# Patient Record
Sex: Female | Born: 1985 | Hispanic: Yes | Marital: Married | Smoking: Never smoker
Health system: Southern US, Community
[De-identification: ages and names within clinical notes are randomized; demographics above are authoritative.]

## PROBLEM LIST (undated history)

## (undated) ENCOUNTER — Inpatient Hospital Stay: Payer: Self-pay

## (undated) DIAGNOSIS — F320 Major depressive disorder, single episode, mild: Secondary | ICD-10-CM

## (undated) DIAGNOSIS — F32A Depression, unspecified: Secondary | ICD-10-CM

## (undated) DIAGNOSIS — G43909 Migraine, unspecified, not intractable, without status migrainosus: Secondary | ICD-10-CM

## (undated) HISTORY — PX: TUBAL LIGATION: SHX77

---

## 2012-08-16 ENCOUNTER — Observation Stay: Payer: Self-pay | Admitting: Obstetrics and Gynecology

## 2012-09-22 ENCOUNTER — Inpatient Hospital Stay: Payer: Self-pay

## 2012-09-22 DIAGNOSIS — O368390 Maternal care for abnormalities of the fetal heart rate or rhythm, unspecified trimester, not applicable or unspecified: Secondary | ICD-10-CM

## 2012-09-22 HISTORY — PX: OTHER SURGICAL HISTORY: SHX169

## 2012-09-22 LAB — CBC WITH DIFFERENTIAL/PLATELET
Basophil #: 0.1 10*3/uL (ref 0.0–0.1)
Basophil %: 0.5 %
Eosinophil #: 0.1 10*3/uL (ref 0.0–0.7)
Eosinophil %: 0.5 %
HGB: 13.1 g/dL (ref 12.0–16.0)
Lymphocyte #: 2.1 10*3/uL (ref 1.0–3.6)
Lymphocyte %: 15.5 %
Monocyte #: 0.8 x10 3/mm (ref 0.2–0.9)
Neutrophil #: 10.6 10*3/uL — ABNORMAL HIGH (ref 1.4–6.5)
Neutrophil %: 77.9 %
Platelet: 160 10*3/uL (ref 150–440)
WBC: 13.6 10*3/uL — ABNORMAL HIGH (ref 3.6–11.0)

## 2012-09-23 LAB — HEMATOCRIT: HCT: 30.8 % — ABNORMAL LOW (ref 35.0–47.0)

## 2014-04-24 NOTE — Op Note (Signed)
PATIENT NAME:  Lauren [name], [place] MR#:  [id number] DATE OF BIRTH:  [birth year]  DATE OF PROCEDURE:  09/22/2012  PREOPERATIVE DIAGNOSIS: Persistent late decelerations at 36 weeks.  POSTOPERATIVE DIAGNOSIS: Persistent late decelerations at 36 weeks with very small teacup saucer size placenta.  PROCEDURE: Primary lower uterine transverse Cesarean section.  SURGEON: Adria Devonarrie Lizza Huffaker, MD  ESTIMATED BLOOD LOSS: 1000 mL.  FINDINGS: Preterm liveborn female infant with very, very small placenta.  DESCRIPTION OF PROCEDURE: The patient was taken to the operating room and placed in the supine position. After adequate spinal anesthesia was established, the patient was prepped and draped in the usual sterile fashion. Pfannenstiel skin incision was drawn and cut approximately 2 fingerbreadths above the pubic symphysis and carried sharply down to the fascia. The fascia was nicked in the midline and the incision was extended in superolateral manner. The rectus bellies were sharply and bluntly dissected off the rectus fascia. The midline of the muscle bellies was identified and split. The peritoneum was grasped, cut and entered. Bladder blade was placed. A bladder flap was created. Uterine incision was made. Infant was delivered. Cord was clamped and cut. Infant was handed to the awaiting pediatrician. Pitocin was started. Placenta was delivered. Uterus was delivered and wrapped in a moist laparotomy sponge. The interior of the uterus was curetted with a moist lap sponge. The belly was cleared of clot. The uterine incision was closed with a running locked chromic suture and a running imbricating chromic suture. The bladder flap was tacked back up to the uterine incision. The uterus was placed back into the abdomen. The gutters were cleared of clots. Muscle bellies were approximated with a running Vicryl suture. On-Q trocars were placed. Catheters were threaded and wrapped underneath the rectus fascia. The fascia was closed with a  running Vicryl suture. The skin was closed with a 4-0 Monocryl. Dermabond was placed on the trocar catheter site. 4 x 4 was placed to wrap the catheters around. Tegaderms were placed. Bandages were placed. The uterus was massaged, and the patient was taken to recovery after having tolerated the procedure with clear urine noted in the Foley bag.  ____________________________ Elliot Gurneyarrie C. Milania Haubner, MD cck:sb D: 10/01/2012 10:37:00 ET T: 10/01/2012 11:25:46 ET JOB#: [id number]  cc: Elliot Gurneyarrie C. Ruie Sendejo, MD, <Dictator> Elliot GurneyARRIE C Gracin Soohoo MD ELECTRONICALLY SIGNED 10/01/2012 22:30

## 2014-05-12 NOTE — H&P (Signed)
L&D Evaluation:  History Expanded:  HPI pthere because she fell to her knees and wanted to be sure baby was ok, she did not hit her belly. she is 31 weeks pregantn unable to get records fromt he office,   Gravida 1   Term 0   PreTerm 0   Abortion 0   Living 0   Group B Strep Results Maternal (Result >5wks must be treated as unknown) unknown/result > 5 weeks ago   [facility]   Presents with fell down   Patient's Medical History No Chronic Illness   Patient's Surgical History none   Medications Pre Natal Vitamins   Allergies NKDA   Social History none   Family History Non-Contributory   ROS:  ROS All systems were reviewed.  HEENT, CNS, GI, GU, Respiratory, CV, Renal and Musculoskeletal systems were found to be normal.   Exam:  Vital Signs stable   Urine Protein not completed   Mental Status clear   Chest clear   Heart normal sinus rhythm   Abdomen gravid, non-tender   Estimated Fetal Weight Average for gestational age   Pelvic no external lesions   Mebranes Intact   Description clear   FHT normal rate with no decels   FHT Description cat 1 tracing   Fetal Heart Rate 140   Ucx absent   Skin dry   Impression:  Impression fell   Plan:  Plan monitor contractions and for cervical change   Comments monitor for ctx and check blood type and montiir for one hour and then send home.   Follow Up Appointment need to schedule   Electronic Signatures: Adria DevonKlett, Yonah Tangeman (MD)  (Signed 15-Aug-14 16:32)  Authored: L&D Evaluation   Last Updated: 15-Aug-14 16:32 by Adria DevonKlett, Yahsir Wickens (MD)

## 2014-05-12 NOTE — H&P (Signed)
L&D Evaluation:  History Expanded:  HPI 29 yo G1P0 at 4934w3d who comes in with contractions since last night st 930pm. she has had an unremarkable prenatal course.glucola 105, declined tdap at the office on 08/12/12. pt has had anemia this pregnancy and has been on iron supplements, she is varcella immune. first and second trimester us verify dates. pt comes in tonight with contractions and is having late decels with most contractons. given terb 0.25mg  subcutaneous x 1 to decrease contractions with good results of decreasing the ctx and thus the lates.   Gravida 1   Term 0   PreTerm 0   Abortion 0   Living 0   Blood Type (Maternal) O positive   Group B Strep Results Maternal (Result >5wks must be treated as unknown) unknown/result > 5 weeks ago   Maternal HIV Negative   Maternal Syphilis Ab Nonreactive   Maternal Varicella Immune   Rubella Results (Maternal) immune   Maternal T-Dap Unknown   [facility]   Presents with contractions, late decels   Patient's Medical History No Chronic Illness   Patient's Surgical History none   Medications Pre Natal Vitamins  Iron   Allergies NKDA   Social History none   Family History Non-Contributory   ROS:  ROS All systems were reviewed.  HEENT, CNS, GI, GU, Respiratory, CV, Renal and Musculoskeletal systems were found to be normal.   Exam:  Vital Signs stable   Urine Protein not completed   General no apparent distress   Mental Status clear   Chest clear   Heart normal sinus rhythm   Estimated Fetal Weight Small for gestational age   Back no CVAT   Edema no edema   Reflexes 1+   Pelvic no external lesions   Mebranes Intact   FHT other   FHT Description Late decelerations, baseline 140   Ucx regular   Ucx Frequency 3 min   Skin dry   Lymph no lymphadenopathy   Impression:  Impression early labor, fetal intolerance to labor remote from delivery   Plan:  Plan EFM/NST   Comments fetal  intolerance to labor will procede to csection, pt aware of reasons, nicu aware   Follow Up Appointment need to schedule. in 6 weeks   Electronic Signatures: Adria DevonKlett, Angeliyah Kirkey (MD)  (Signed 21-Sep-14 04:38)  Authored: L&D Evaluation   Last Updated: 21-Sep-14 04:38 by Adria DevonKlett, Lianny Molter (MD)

## 2014-10-20 ENCOUNTER — Ambulatory Visit (INDEPENDENT_AMBULATORY_CARE_PROVIDER_SITE_OTHER): Payer: BLUE CROSS/BLUE SHIELD | Admitting: Sports Medicine

## 2014-10-20 ENCOUNTER — Ambulatory Visit: Payer: Self-pay

## 2014-10-20 ENCOUNTER — Encounter: Payer: Self-pay | Admitting: Sports Medicine

## 2014-10-20 VITALS — BP 112/69 | HR 59 | Resp 16 | Ht 66.0 in | Wt 155.0 lb

## 2014-10-20 DIAGNOSIS — M79671 Pain in right foot: Secondary | ICD-10-CM

## 2014-10-20 DIAGNOSIS — M216X2 Other acquired deformities of left foot: Secondary | ICD-10-CM | POA: Diagnosis not present

## 2014-10-20 DIAGNOSIS — M722 Plantar fascial fibromatosis: Secondary | ICD-10-CM

## 2014-10-20 DIAGNOSIS — M216X1 Other acquired deformities of right foot: Secondary | ICD-10-CM

## 2014-10-20 MED ORDER — MELOXICAM 15 MG PO TABS: 15.0000 mg | ORAL_TABLET | Freq: Every day | ORAL | Status: DC

## 2014-10-20 MED ORDER — TRIAMCINOLONE ACETONIDE 10 MG/ML IJ SUSP: 10.0000 mg | Freq: Once | INTRAMUSCULAR | Status: DC

## 2014-10-20 NOTE — Patient Instructions (Signed)
Plantar Fasciitis Plantar fasciitis is a painful foot condition that affects the heel. It occurs when the band of tissue that connects the toes to the heel bone (plantar fascia) becomes irritated. This can happen after exercising too much or doing other repetitive activities (overuse injury). The pain from plantar fasciitis can range from mild irritation to severe pain that makes it difficult for you to walk or move. The pain is usually worse in the morning or after you have been sitting or lying down for a while. CAUSES This condition may be caused by:  Standing for long periods of time.  Wearing shoes that do not fit.  Doing high-impact activities, including running, aerobics, and ballet.  Being overweight.  Having an abnormal way of walking (gait).  Having tight calf muscles.  Having high arches in your feet.  Starting a new athletic activity. SYMPTOMS The main symptom of this condition is heel pain. Other symptoms include:  Pain that gets worse after activity or exercise.  Pain that is worse in the morning or after resting.  Pain that goes away after you walk for a few minutes. DIAGNOSIS This condition may be diagnosed based on your signs and symptoms. Your health care provider will also do a physical exam to check for:  A tender area on the bottom of your foot.  A high arch in your foot.  Pain when you move your foot.  Difficulty moving your foot. You may also need to have imaging studies to confirm the diagnosis. These can include:  X-rays.  Ultrasound.  MRI. TREATMENT  Treatment for plantar fasciitis depends on the severity of the condition. Your treatment may include:  Rest, ice, and over-the-counter pain medicines to manage your pain.  Exercises to stretch your calves and your plantar fascia.  A splint that holds your foot in a stretched, upward position while you sleep (night splint).  Physical therapy to relieve symptoms and prevent problems in the  future.  Cortisone injections to relieve severe pain.  Extracorporeal shock wave therapy (ESWT) to stimulate damaged plantar fascia with electrical impulses. It is often used as a last resort before surgery.  Surgery, if other treatments have not worked after 12 months. HOME CARE INSTRUCTIONS  Take medicines only as directed by your health care provider.  Avoid activities that cause pain.  Roll the bottom of your foot over a bag of ice or a bottle of cold water. Do this for 20 minutes, 3-4 times a day.  Perform simple stretches as directed by your health care provider.  Try wearing athletic shoes with air-sole or gel-sole cushions or soft shoe inserts.  Wear a night splint while sleeping, if directed by your health care provider.  Keep all follow-up appointments with your health care provider. PREVENTION   Do not perform exercises or activities that cause heel pain.  Consider finding low-impact activities if you continue to have problems.  Lose weight if you need to. The best way to prevent plantar fasciitis is to avoid the activities that aggravate your plantar fascia. SEEK MEDICAL CARE IF:  Your symptoms do not go away after treatment with home care measures.  Your pain gets worse.  Your pain affects your ability to move or do your daily activities.   This information is not intended to replace advice given to you by your health care provider. Make sure you discuss any questions you have with your health care provider.   Document Released: 09/13/2000 Document Revised: 09/09/2014 Document Reviewed: 10/29/2013 Elsevier   Interactive Patient Education 2016 [employer].  

## 2014-10-20 NOTE — Progress Notes (Deleted)
   Subjective:    Patient ID: Lauren CollumHilda B Miguez, female    DOB: 04/09/1985, 29 y.o.   MRN: [id number]  HPI    Review of Systems  Cardiovascular: Positive for leg swelling.       Calf pain with walking   Musculoskeletal: Positive for gait problem.  All other systems reviewed and are negative.      Objective:   Physical Exam        Assessment & Plan:

## 2014-10-20 NOTE — Progress Notes (Signed)
Patient ID: Lauren CollumHilda B [name], female   DOB: 05/06/1985, 29 y.o.   MRN: [id number] Subjective: Lauren [name] is a 29 y.o. female patient presents to office with complaint of heel pain on the right heel. Patient admits to post static dyskinesia for 3 months in duration. Patient has treated this problem with changing shoes and cutting back on running. Patient reports that this treatment has offered some relief but pain still present especially after a long work shift.  There are no active problems to display for this patient.  No current outpatient prescriptions on file prior to visit.   No current facility-administered medications on file prior to visit.   No Known Allergies  Review of Systems  Cardiovascular: Positive for leg swelling.       Calf pain with walking   Musculoskeletal: Positive for gait problem.  All other systems reviewed and are negative.   Objective: Physical Exam General: The patient is alert and oriented x3 in no acute distress.  Dermatology: Skin is warm, dry and supple bilateral lower extremities. Nails 1-10 are normal. There is no erythema, edema, no eccymosis, no open lesions present. Integument is otherwise unremarkable.  Vascular: Dorsalis Pedis pulse and Posterior Tibial pulse are 2/4 bilateral. Capillary fill time is immediate to all digits.  Neurological: Grossly intact to light touch with an achilles reflex of +2/5 and a  negative Tinel's sign bilateral.  Musculoskeletal: Tenderness to palpation at the medial calcaneal tubercale and through the insertion of the plantar fascia on the right foot. No pain with compression of calcaneus bilateral. No pain with tuning fork to calcaneus bilateral. No pain with calf compression bilateral. There is decreased Ankle joint range of motion right>left. All other joints within normal limits with no pain or crepitus.   Gait: Unassisted, Antalgic avoid weight on right heel  Xray; 3 views right foot: calcaneal  enthesopathy, No fracture/discloation.   Assessment and Plan: Problem List Items Addressed This Visit    None    Visit Diagnoses    Right foot pain    -  Primary    Relevant Medications    meloxicam (MOBIC) 15 MG tablet    Other Relevant Orders    DG Foot Complete Right    Plantar fasciitis of right foot        Relevant Medications    triamcinolone acetonide (KENALOG) 10 MG/ML injection 10 mg    Acquired equinus deformity of both feet        right>left      -Complete examination performed. Discussed with patient in detail the condition of plantar fasciitis, how this occurs and general treatment options. Explained both conservative and surgical treatments.  -After oral consent and aseptic prep, injected a mixture containing 1 ml of 1%  plain lidocaine, 1 ml 0.25% plain marcaine, 0.5 ml of kenalog 10 and 0.5 ml of dexamethasone phosphate into right heel. Post-injection care discussed with patient.  -Rx Meloxicam 15mg  PO daily -Recommended good supportive shoes and advised use of OTC insert.  - Explained in detail the use of the fascial brace/ night splint which was dispensed at today's visit. -Explained and dispensed to patient daily stretching exercises. -Recommend patient to ice affected area 1-2x daily. -Patient to return to office in 4 weeks for follow up or sooner if problems or questions  Arise.  Asencion Islamitorya Roshelle Traub, DPM

## 2014-11-17 ENCOUNTER — Encounter: Payer: Self-pay | Admitting: Sports Medicine

## 2014-11-17 ENCOUNTER — Ambulatory Visit (INDEPENDENT_AMBULATORY_CARE_PROVIDER_SITE_OTHER): Payer: BLUE CROSS/BLUE SHIELD | Admitting: Sports Medicine

## 2014-11-17 DIAGNOSIS — M216X1 Other acquired deformities of right foot: Secondary | ICD-10-CM | POA: Diagnosis not present

## 2014-11-17 DIAGNOSIS — M216X2 Other acquired deformities of left foot: Secondary | ICD-10-CM

## 2014-11-17 DIAGNOSIS — M722 Plantar fascial fibromatosis: Secondary | ICD-10-CM

## 2014-11-17 DIAGNOSIS — M79671 Pain in right foot: Secondary | ICD-10-CM | POA: Diagnosis not present

## 2014-11-17 NOTE — Progress Notes (Signed)
Patient ID: Lauren [name], female   DOB: 12/30/1985, 29 y.o.   MRN: [id number] Subjective: Lauren [name] is a 29 y.o. female returns to office for follow up evaluation after Right heel injection for plantar fasciitis, injection #1 administered 1 month(s) ago. Patient states that the injection seems to help her pain; pain has decreased in frequency to the area. Admits to pregnancy. Patient denies any other recent changes in medications or new problems since last visit.   There are no active problems to display for this patient.  Current Outpatient Prescriptions on File Prior to Visit  Medication Sig Dispense Refill  . meloxicam (MOBIC) 15 MG tablet Take 1 tablet (15 mg total) by mouth daily. 30 tablet 0   Current Facility-Administered Medications on File Prior to Visit  Medication Dose Route Frequency Provider Last Rate Last Dose  . triamcinolone acetonide (KENALOG) 10 MG/ML injection 10 mg  10 mg Other Once [employer] Koen Antilla, DPM       No Known Allergies  Objective:   General:  Alert and oriented x 3, in no acute distress  Dermatology: Skin is warm, dry, and supple bilateral. Nails are within normal limits. There is no lower extremity erythema, no eccymosis, no open lesions present bilateral.   Vascular: Dorsalis Pedis and Posterior Tibial pedal pulses are 2/4 bilateral. + hair growth noted bilateral. Capillary Fill Time is 3 seconds in all digits. No varicosities, No edema bilateral lower extremities.   Neurological: Sensation grossly intact to light touch with an achilles reflex of +2 and a  negative Tinel's sign bilateral. Vibratory, sharp/dull, Semmes Weinstein Monofilament within normal limits.   Musculoskeletal: There is decreased tenderness to palpation at the medial calcaneal tubercale and through the insertion of the plantar fascia on the right foot. No pain with compression to calcaneus or application of tuning fork. There is decreased Ankle joint range of motion bilateral,  right>left. All other jointsrange of motion  within normal limits bilateral. Strength 5/5 bilateral.   Assessment and Plan: Problem List Items Addressed This Visit    None    Visit Diagnoses    Right foot pain    -  Primary    Plantar fasciitis of right foot        Improving    Acquired equinus deformity of both feet           -Complete examination performed.  -Discussed with patient in detail the condition of plantar fasciitis, how this  occurs related to the foot type of the patient and general treatment options. - Patient stopped Meloxicam because just found out that she is pregnant; recommend to keep with discontinuing to take medication for safety of fetal development -Dispensed ice pack and explained proper use.  -Continue with fascial brace and night splint daily.  -Continue with stretching, icing, good supportive shoes, inserts daily.  -Discussed long term care and reocurrence; will closely monitor; if fails to improve will consider other treatment modalities.  -Patient to return to office in 1 month for follow up or sooner if problems or questions arise.  Asencion Islamitorya Mysty Kielty, DPM

## 2014-12-15 ENCOUNTER — Ambulatory Visit: Payer: BLUE CROSS/BLUE SHIELD | Admitting: Sports Medicine

## 2014-12-22 ENCOUNTER — Ambulatory Visit (INDEPENDENT_AMBULATORY_CARE_PROVIDER_SITE_OTHER): Payer: BLUE CROSS/BLUE SHIELD | Admitting: Sports Medicine

## 2014-12-22 ENCOUNTER — Encounter: Payer: Self-pay | Admitting: Sports Medicine

## 2014-12-22 DIAGNOSIS — M79671 Pain in right foot: Secondary | ICD-10-CM | POA: Diagnosis not present

## 2014-12-22 DIAGNOSIS — M722 Plantar fascial fibromatosis: Secondary | ICD-10-CM | POA: Diagnosis not present

## 2014-12-22 DIAGNOSIS — M216X2 Other acquired deformities of left foot: Secondary | ICD-10-CM

## 2014-12-22 DIAGNOSIS — M216X1 Other acquired deformities of right foot: Secondary | ICD-10-CM | POA: Diagnosis not present

## 2014-12-22 NOTE — Progress Notes (Signed)
Patient ID: Lauren CollumHilda B [name], female   DOB: 05/19/1985, 29 y.o.   MRN: [id number] Subjective: Lauren [name] is a 29 y.o. female (expecting mother) returns to office for follow up evaluation after Right heel injection for plantar fasciitis, injection #1 administered 2 month(s) ago. Patient states that the injection seems to help her pain; pain is no longer present area. States that she has stopped wearing brace and that she tried to wear flat shoes with minimal issues. Patient denies any other recent changes in medications or new problems since last visit.   There are no active problems to display for this patient.  No current outpatient prescriptions on file prior to visit.   Current Facility-Administered Medications on File Prior to Visit  Medication Dose Route Frequency Provider Last Rate Last Dose  . triamcinolone acetonide (KENALOG) 10 MG/ML injection 10 mg  10 mg Other Once [employer] Jamilia Jacques, DPM       No Known Allergies  Objective:   General:  Alert and oriented x 3, in no acute distress  Dermatology: Skin is warm, dry, and supple bilateral. Nails are within normal limits. There is no lower extremity erythema, no eccymosis, no open lesions present bilateral.   Vascular: Dorsalis Pedis and Posterior Tibial pedal pulses are 2/4 bilateral. + hair growth noted bilateral. Capillary Fill Time is 3 seconds in all digits. No varicosities, No edema bilateral lower extremities.   Neurological: Sensation grossly intact to light touch with an achilles reflex of +2 and a  negative Tinel's sign bilateral. Vibratory, sharp/dull, Semmes Weinstein Monofilament within normal limits.   Musculoskeletal: There is minimal tenderness to palpation at the medial calcaneal tubercale and through the insertion of the plantar fascia on the right foot. No pain with compression to calcaneus or application of tuning fork. There is decreased Ankle joint range of motion bilateral, right>left. All other jointsrange of  motion  within normal limits bilateral. Strength 5/5 bilateral.   Assessment and Plan: Problem List Items Addressed This Visit    None    Visit Diagnoses    Plantar fasciitis of right foot    -  Primary    much improved    Acquired equinus deformity of both feet        Right foot pain        much improved       -Complete examination performed.  -Discussed with patient in detail the condition of plantar fasciitis, how this  occurs related to the foot type of the patient and general treatment options. -Continue with fascial brace and night splint daily for 1 more month.  -Continue with stretching, icing, good supportive shoes, inserts daily.  -Discussed long term care and reocurrence; will closely monitor especially in the setting of pregnancy -Patient to return to office as needed for follow up or sooner if problems or questions arise.  Asencion Islamitorya Undine Nealis, DPM

## 2015-07-04 ENCOUNTER — Observation Stay
Admission: EM | Admit: 2015-07-04 | Discharge: 2015-07-05 | Disposition: A | Discharge status: Home / Self Care | Payer: BLUE CROSS/BLUE SHIELD | Source: Home / Self Care | Admitting: Obstetrics and Gynecology

## 2015-07-04 HISTORY — DX: Major depressive disorder, single episode, mild: F32.0

## 2015-07-04 HISTORY — DX: Depression, unspecified: F32.A

## 2015-07-04 HISTORY — DX: Migraine, unspecified, not intractable, without status migrainosus: G43.909

## 2015-07-05 ENCOUNTER — Encounter: Payer: Self-pay | Admitting: Certified Nurse Midwife

## 2015-07-05 ENCOUNTER — Inpatient Hospital Stay: Payer: BLUE CROSS/BLUE SHIELD | Admitting: Anesthesiology

## 2015-07-05 ENCOUNTER — Inpatient Hospital Stay
Admission: EM | Admit: 2015-07-05 | Discharge: 2015-07-08 | DRG: 765 | Disposition: A | Discharge status: Home / Self Care | Payer: BLUE CROSS/BLUE SHIELD | Attending: Obstetrics & Gynecology | Admitting: Obstetrics & Gynecology

## 2015-07-05 ENCOUNTER — Encounter
Admission: EM | Disposition: A | Discharge status: Home / Self Care | Payer: Self-pay | Source: Home / Self Care | Attending: Obstetrics & Gynecology

## 2015-07-05 DIAGNOSIS — D62 Acute posthemorrhagic anemia: Secondary | ICD-10-CM | POA: Diagnosis present

## 2015-07-05 DIAGNOSIS — Z3A38 38 weeks gestation of pregnancy: Secondary | ICD-10-CM

## 2015-07-05 DIAGNOSIS — O9902 Anemia complicating childbirth: Secondary | ICD-10-CM | POA: Diagnosis present

## 2015-07-05 DIAGNOSIS — O3443 Maternal care for other abnormalities of cervix, third trimester: Secondary | ICD-10-CM | POA: Diagnosis present

## 2015-07-05 DIAGNOSIS — Z302 Encounter for sterilization: Secondary | ICD-10-CM | POA: Diagnosis not present

## 2015-07-05 DIAGNOSIS — O34211 Maternal care for low transverse scar from previous cesarean delivery: Principal | ICD-10-CM | POA: Diagnosis present

## 2015-07-05 DIAGNOSIS — N841 Polyp of cervix uteri: Secondary | ICD-10-CM | POA: Diagnosis present

## 2015-07-05 DIAGNOSIS — O34219 Maternal care for unspecified type scar from previous cesarean delivery: Secondary | ICD-10-CM | POA: Diagnosis present

## 2015-07-05 DIAGNOSIS — O0993 Supervision of high risk pregnancy, unspecified, third trimester: Secondary | ICD-10-CM

## 2015-07-05 LAB — CBC
HCT: 31.8 % — ABNORMAL LOW (ref 35.0–47.0)
HEMATOCRIT: 29.6 % — AB (ref 35.0–47.0)
HEMOGLOBIN: 10 g/dL — AB (ref 12.0–16.0)
HEMOGLOBIN: 10.9 g/dL — AB (ref 12.0–16.0)
MCH: 27.4 pg (ref 26.0–34.0)
MCH: 27.3 pg (ref 26.0–34.0)
MCHC: 33.9 g/dL (ref 32.0–36.0)
MCHC: 34.4 g/dL (ref 32.0–36.0)
MCV: 80.7 fL (ref 80.0–100.0)
MCV: 79.4 fL — AB (ref 80.0–100.0)
PLATELETS: 177 10*3/uL (ref 150–440)
Platelets: 149 10*3/uL — ABNORMAL LOW (ref 150–440)
RBC: 3.67 MIL/uL — ABNORMAL LOW (ref 3.80–5.20)
RBC: 4 MIL/uL (ref 3.80–5.20)
RDW: 13.5 % (ref 11.5–14.5)
RDW: 13.8 % (ref 11.5–14.5)
WBC: 9.4 10*3/uL (ref 3.6–11.0)
WBC: 11.2 10*3/uL — AB (ref 3.6–11.0)

## 2015-07-05 LAB — TYPE AND SCREEN
ABO/RH(D): O POS
ANTIBODY SCREEN: NEGATIVE

## 2015-07-05 SURGERY — Surgical Case
Anesthesia: Spinal | Wound class: Clean Contaminated

## 2015-07-05 MED ORDER — MORPHINE SULFATE (PF) 0.5 MG/ML IJ SOLN
INTRAMUSCULAR | Status: DC | PRN
  Administered 2015-07-05: .2 mg via EPIDURAL

## 2015-07-05 MED ORDER — OXYTOCIN 40 UNITS IN LACTATED RINGERS INFUSION - SIMPLE MED
2.5000 [IU]/h | INTRAVENOUS | Status: DC
  Administered 2015-07-05: 1 mL via INTRAVENOUS
  Administered 2015-07-06: 399 mL via INTRAVENOUS

## 2015-07-05 MED ORDER — SOD CITRATE-CITRIC ACID 500-334 MG/5ML PO SOLN
ORAL | Status: AC
  Administered 2015-07-05: 30 mL via ORAL
  Filled 2015-07-05: qty 15

## 2015-07-05 MED ORDER — LACTATED RINGERS IV SOLN: INTRAVENOUS | Status: DC

## 2015-07-05 MED ORDER — SODIUM CHLORIDE 0.9 % IV SOLN
INTRAVENOUS | Status: DC | PRN
  Administered 2015-07-05: 50 mL via INTRAMUSCULAR

## 2015-07-05 MED ORDER — TERBUTALINE SULFATE 1 MG/ML IJ SOLN
0.2500 mg | Freq: Once | INTRAMUSCULAR | Status: AC
  Administered 2015-07-05: 0.25 mg via SUBCUTANEOUS
  Filled 2015-07-05: qty 1

## 2015-07-05 MED ORDER — CEFAZOLIN SODIUM-DEXTROSE 2-4 GM/100ML-% IV SOLN
INTRAVENOUS | Status: AC
  Filled 2015-07-05: qty 100

## 2015-07-05 MED ORDER — LACTATED RINGERS IV SOLN
INTRAVENOUS | Status: DC | PRN
  Administered 2015-07-05: 23:00:00 via INTRAVENOUS

## 2015-07-05 MED ORDER — KETOROLAC TROMETHAMINE 30 MG/ML IJ SOLN: 30.0000 mg | Freq: Four times a day (QID) | INTRAMUSCULAR | Status: AC | PRN

## 2015-07-05 MED ORDER — BUPIVACAINE HCL (PF) 0.5 % IJ SOLN: 10.0000 mL | Freq: Once | INTRAMUSCULAR | Status: DC

## 2015-07-05 MED ORDER — LACTATED RINGERS IV BOLUS (SEPSIS)
500.0000 mL | Freq: Once | INTRAVENOUS | Status: AC
  Administered 2015-07-05: 500 mL via INTRAVENOUS

## 2015-07-05 MED ORDER — OXYTOCIN BOLUS FROM INFUSION: 500.0000 mL | INTRAVENOUS | Status: DC

## 2015-07-05 MED ORDER — PHENYLEPHRINE HCL 10 MG/ML IJ SOLN
INTRAMUSCULAR | Status: DC | PRN
  Administered 2015-07-05: 100 ug via INTRAVENOUS

## 2015-07-05 MED ORDER — ACETAMINOPHEN 650 MG RE SUPP
650.0000 mg | Freq: Once | RECTAL | Status: AC
  Administered 2015-07-05: 650 mg via RECTAL
  Filled 2015-07-05: qty 1

## 2015-07-05 MED ORDER — BUPIVACAINE 0.25 % ON-Q PUMP DUAL CATH 400 ML: 400.0000 mL | INJECTION | Status: DC

## 2015-07-05 MED ORDER — LACTATED RINGERS IV SOLN: 500.0000 mL | INTRAVENOUS | Status: DC | PRN

## 2015-07-05 MED ORDER — KETOROLAC TROMETHAMINE 30 MG/ML IJ SOLN
30.0000 mg | Freq: Four times a day (QID) | INTRAMUSCULAR | Status: AC | PRN
Start: 2015-07-05 — End: 2015-07-06

## 2015-07-05 MED ORDER — ONDANSETRON HCL 4 MG/2ML IJ SOLN
INTRAMUSCULAR | Status: DC | PRN
  Administered 2015-07-05: 4 mg via INTRAVENOUS

## 2015-07-05 MED ORDER — BUPIVACAINE LIPOSOME 1.3 % IJ SUSP
INTRAMUSCULAR | Status: DC | PRN
  Administered 2015-07-05: 50 mL

## 2015-07-05 MED ORDER — BUPIVACAINE IN DEXTROSE 0.75-8.25 % IT SOLN
INTRATHECAL | Status: DC | PRN
  Administered 2015-07-05: 1.6 mL via INTRATHECAL

## 2015-07-05 MED ORDER — SOD CITRATE-CITRIC ACID 500-334 MG/5ML PO SOLN
30.0000 mL | ORAL | Status: AC
  Administered 2015-07-05: 30 mL via ORAL

## 2015-07-05 MED ORDER — CEFAZOLIN SODIUM-DEXTROSE 2-4 GM/100ML-% IV SOLN
2.0000 g | INTRAVENOUS | Status: AC
  Administered 2015-07-05: 2 g via INTRAVENOUS

## 2015-07-05 MED ORDER — BUPIVACAINE HCL (PF) 0.5 % IJ SOLN
INTRAMUSCULAR | Status: AC
  Filled 2015-07-05: qty 30

## 2015-07-05 MED ORDER — EPHEDRINE SULFATE 50 MG/ML IJ SOLN
INTRAMUSCULAR | Status: DC | PRN
  Administered 2015-07-05: 5 mg via INTRAVENOUS

## 2015-07-05 MED ORDER — BUPIVACAINE 0.25 % ON-Q PUMP DUAL CATH 400 ML
INJECTION | Status: AC
  Filled 2015-07-05: qty 400

## 2015-07-05 SURGICAL SUPPLY — 33 items
BASIN GRAD PLASTIC 32OZ STRL (MISCELLANEOUS) ×3 IMPLANT
CANISTER SUCT 3000ML (MISCELLANEOUS) ×3 IMPLANT
CATH KIT ON-Q SILVERSOAK 5IN (CATHETERS) IMPLANT
CLOSURE WOUND 1/2 X4 (GAUZE/BANDAGES/DRESSINGS) ×1
DRSG TELFA 3X8 NADH (GAUZE/BANDAGES/DRESSINGS) ×3 IMPLANT
ELECT CAUTERY BLADE 6.4 (BLADE) ×3 IMPLANT
ELECT REM PT RETURN 9FT ADLT (ELECTROSURGICAL) ×3
ELECTRODE REM PT RTRN 9FT ADLT (ELECTROSURGICAL) ×1 IMPLANT
GAUZE SPONGE 4X4 12PLY STRL (GAUZE/BANDAGES/DRESSINGS) ×3 IMPLANT
GLOVE BIOGEL PI IND STRL 6.5 (GLOVE) ×6 IMPLANT
GLOVE BIOGEL PI INDICATOR 6.5 (GLOVE) ×12
GLOVE SURG SYN 6.5 ES PF (GLOVE) ×3 IMPLANT
GOWN STRL REUS W/ TWL LRG LVL3 (GOWN DISPOSABLE) ×3 IMPLANT
GOWN STRL REUS W/TWL LRG LVL3 (GOWN DISPOSABLE) ×6
LIQUID BAND (GAUZE/BANDAGES/DRESSINGS) ×3 IMPLANT
NEEDLE HYPO 22GX1.5 SAFETY (NEEDLE) ×3 IMPLANT
NS IRRIG 1000ML POUR BTL (IV SOLUTION) ×3 IMPLANT
PACK C SECTION AR (MISCELLANEOUS) ×3 IMPLANT
PAD OB MATERNITY 4.3X12.25 (PERSONAL CARE ITEMS) ×3 IMPLANT
PAD PREP 24X41 OB/GYN DISP (PERSONAL CARE ITEMS) ×3 IMPLANT
STRAP SAFETY BODY (MISCELLANEOUS) ×3 IMPLANT
STRIP CLOSURE SKIN 1/2X4 (GAUZE/BANDAGES/DRESSINGS) ×2 IMPLANT
SUT MNCRL 4-0 (SUTURE) ×2
SUT MNCRL 4-0 27XMFL (SUTURE) ×1
SUT PDS AB 1 TP1 96 (SUTURE) ×3 IMPLANT
SUT VIC AB 0 CT1 36 (SUTURE) ×6 IMPLANT
SUT VIC AB 2-0 CT1 27 (SUTURE) ×8
SUT VIC AB 2-0 CT1 TAPERPNT 27 (SUTURE) ×4 IMPLANT
SUT VIC AB 3-0 SH 27 (SUTURE) ×2
SUT VIC AB 3-0 SH 27X BRD (SUTURE) ×1 IMPLANT
SUTURE MNCRL 4-0 27XMF (SUTURE) ×1 IMPLANT
SWABSTK COMLB BENZOIN TINCTURE (MISCELLANEOUS) ×3 IMPLANT
SYR 30ML LL (SYRINGE) ×3 IMPLANT

## 2015-07-05 NOTE — Anesthesia Preprocedure Evaluation (Signed)
Anesthesia Evaluation  Patient identified by MRN, date of birth, ID band Patient awake    Reviewed: Allergy & Precautions, NPO status , Patient's Chart, lab work & pertinent test results, reviewed documented beta blocker date and time   Airway Mallampati: II  TM Distance: >3 FB     Dental  (+) Chipped   Pulmonary           Cardiovascular      Neuro/Psych  Headaches, PSYCHIATRIC DISORDERS Depression    GI/Hepatic   Endo/Other    Renal/GU      Musculoskeletal   Abdominal   Peds  Hematology   Anesthesia Other Findings   Reproductive/Obstetrics                             Anesthesia Physical Anesthesia Plan  ASA: II  Anesthesia Plan: Spinal   Post-op Pain Management:    Induction:   Airway Management Planned:   Additional Equipment:   Intra-op Plan:   Post-operative Plan:   Informed Consent: I have reviewed the patients History and Physical, chart, labs and discussed the procedure including the risks, benefits and alternatives for the proposed anesthesia with the patient or authorized representative who has indicated his/her understanding and acceptance.     Plan Discussed with: CRNA  Anesthesia Plan Comments:         Anesthesia Quick Evaluation

## 2015-07-05 NOTE — H&P (Signed)
OB History & Physical   History of Present Illness:  Chief Complaint:  Complains of contractions since 1000 this am. Denies bleeding or leakage of fluid. +FM  HPI:  Lauren [name] is a 30 y.o. G1P0 female with EDC=07/13/2015 presents at 4479w6d dated by a 9wk3d. Her pregnancy has been complicated by a history of a LTCS at 36 weeks for a nonreassuring FHR tracing at 36 weeks.. She is scheduled for a repat Cesarean section and BTL on 6 July. . She presents to L&D for evaluation of labor. Pt was evaluated for the same complaint just after midnight this morning, d/c home after terbutaline and IV hydration.  Prenatal care site: [facility] has also been remarkable for an asymptomatic cervical polyp and an elevated one hour=152 and a normal 3hr GTT. She desires sterilization. Wants to bottle feed. Declined TDAP during pregnancy.     Maternal Medical History:   Past Medical History  Diagnosis Date  . Migraine   . Mild depression     age 30    Past Surgical History  Procedure Laterality Date  . Low transverse cesarean      Klett-Fetal indications at 36 wk    Allergies  Allergen Reactions  . Peanut-Containing Drug Products Rash    Prior to Admission medications   Medication Sig Start Date End Date Taking? Authorizing Provider  Prenat-FeFum-FePo-FA-Omega 3 (CONCEPT DHA) 53.5-38-1 MG CAPS TAKE 1 CAPSULE BY ORAL ROUTE ONCE DAILY 11/20/14   Historical Provider, MD          Social History: She  reports that she has never smoked. She has never used smokeless tobacco. She reports that she does not drink alcohol or use illicit drugs.  Family History: family history is not on file.   Review of Systems: Negative x 10 systems reviewed except as noted in the HPI.     Physical Exam:  Vital Signs: BP 126/77 mmHg  Pulse 86  Temp(Src) 97.1 F (36.2 C) (Oral)  Resp 18  Ht 5\' 6"  (1.676 m)   Wt 81.194 kg (179 lb)  BMI 28.91 kg/m2  LMP (LMP Unknown) General: appears uncomfortable  HEENT: normocephalic, atraumatic Heart: regular rate & rhythm. No murmurs Lungs: clear to auscultation bilaterally Abdomen: soft, gravid, tender with ctx. Pelvic:  External: Normal external female genitalia Cervix: Dilation: 1.5 / Effacement (%): 90 / Station: -1 /posterior/ medium soft/ cephalic (confirmed by bedside US) Neurologic: Alert & oriented x 3.   Pertinent Results:  Prenatal Labs: Blood type/Rh O positive  Antibody screen negative  Rubella Varicella Immune Immune  RPR Non reactive  HBsAg negative  HIV negative  GC negative  Chlamydia negative  Genetic screening declined  1 hour GTT 152  3 hour GTT 78/ 167/101/91  GBS negative on 6/15   Baseline FHR: 135 baseline with accelerations, moderate variability, no decelerations Toco: q2-4 min   Assessment:  Lauren [name] is a 30 y.o. G1P0 female at 1179w6d with history of prior LTCS with contractions in early labor  Plan: 1.  Admit to Labor & Delivery - Dr Elesa MassedWard notified. Decision to proceed with repeat C-section given pt's 2nd admission for contractions in 24 hours.  2. CBC, RPR, T&S, NPO, IVF bolus 3. GBS negative  4. Obtain consents 5. Repeat Cesarean section with BTL.

## 2015-07-05 NOTE — OB Triage Note (Signed)
Pt presents with c/o ctx since last night that have intensified as of 4pm this afternoon.  Denies LOF or vaginal bleeding.  Reports +FM.  Reports burning with urination.

## 2015-07-05 NOTE — Progress Notes (Signed)
Pt transfer to OR for repeat c/s.

## 2015-07-05 NOTE — Final Progress Note (Signed)
Physician Final Progress Note  Patient ID: Lauren [name] MRN: [id number] DOB/AGE: 31/08/1985 30 y.o.  Admit date: 07/04/2015 Admitting provider: Conard NovakStephen D Jackson, MD Discharge date: 07/05/2015   Admission Diagnoses: IUP at 38wk6d Previous Cesarean section Contractions  Discharge Diagnoses:  IUP at 38.6 weeks  False labor Consults: none  Significant Findings/ Diagnostic Studies: 30 year old G2 p1001 with EDC=07/13/2015 presented at 38wk6 days with complaints of contractions. On arrival cervix was 1 cm and extremely posterior/ long and -1 station. Patient initially received hydration and then was given one dose of terbutaline after which her contractions spaced out and she was able to rest. This morning she has an occasional contraction but most of the uterine activity is uterine irritability. FHR tracing has been Cat1. She was discharged home and [facility]:20 this morning. She is scheduled for a repeat Cesarean section and BTL 6 July. Labor precautions were given.  Procedures:none  Discharge Condition: stable  Disposition: Home  Diet: Regular diet  Discharge Activity: Activity as tolerated     Medication List    ASK your doctor about these medications        CONCEPT DHA 53.5-38-1 MG Caps  TAKE 1 CAPSULE BY ORAL ROUTE ONCE DAILY           Follow-up Information    Follow up with Leola Brazilhelsea C Ward, MD. Go today.   Specialty:  Obstetrics and Gynecology   Why:  keep 820 appt. this morning for pre-op    Contact information:   [address] [place] [id number] [phone]       Total time spent taking care of this patient: 20 minutes  Signed: Farrel ConnersGUTIERREZ, Sayf Kerner 07/05/2015, 7:54 AM

## 2015-07-05 NOTE — H&P (Signed)
OB History & Physical   History of Present Illness:  Chief Complaint:  Complains of contractions since 2100. Denies bleeding or leakage of fluid HPI:  Lauren [name] is a 30 y.o. G1P0 female with EDC=07/13/2015 presents at 1939w6d dated by a 9wk3d.  Her pregnancy has been complicated by a history of a LTCS at 36 weeks for a nonreassuring FHR tracing at 36 weeks.. She is  scheduled for a repat Cesarean section and BTL on 6 July. .  She presents to L&D for evaluation of labor.    Prenatal care site: [facility] has also been remarkable for an asymptomatic  cervical polyp and an elevated one hour=152 and a normal 3hr GTT. She desires sterilization. Wants to bottle feed. Declined TDAP during pregnancy.      Maternal Medical History:   Past Medical History  Diagnosis Date  . Migraine   . Mild depression     age 30    Past Surgical History  Procedure Laterality Date  . Low transverse cesarean      Klett-Fetal indications at 36 wk    Allergies  Allergen Reactions  . Peanut-Containing Drug Products Rash    Prior to Admission medications   Medication Sig Start Date End Date Taking? Authorizing Provider  Prenat-FeFum-FePo-FA-Omega 3 (CONCEPT DHA) 53.5-38-1 MG CAPS TAKE 1 CAPSULE BY ORAL ROUTE ONCE DAILY 11/20/14   Historical Provider, MD          Social History: She  reports that she has never smoked. She has never used smokeless tobacco. She reports that she does not drink alcohol or use illicit drugs.  Family History: family history is not on file.   Review of Systems: Negative x 10 systems reviewed except as noted in the HPI.      Physical Exam:  Vital Signs: BP 126/77 mmHg  Pulse 86  Temp(Src) 97.1 F (36.2 C) (Oral)  Resp 18  Ht 5\' 6"  (1.676 m)  Wt 81.194 kg (179 lb)  BMI 28.91 kg/m2  LMP  (LMP Unknown) General: appears uncomfortable  HEENT: normocephalic, atraumatic Heart: regular rate & rhythm.  No murmurs Lungs: clear to  auscultation bilaterally Abdomen: soft, gravid, non-tender;  EFW: 7# Pelvic:   External: Normal external female genitalia  Cervix: Dilation: 1 / Effacement (%): 70 / Station: -1, 0 / extremely posterior/ medium soft/ cephalic  Extremities: non-tender, symmetric, trace edema bilaterally.  DTRs: +1 to +2 Neurologic: Alert & oriented x 3.    Pertinent Results:  Prenatal Labs: Blood type/Rh O positive  Antibody screen negative  Rubella Varicella Immune Immune  RPR Non reactive  HBsAg negative  HIV negative  GC negative  Chlamydia negative  Genetic screening declined  1 hour GTT 152  3 hour GTT 78/ 167/101/91  GBS negative on 6/15   Baseline FHR: 135 baseline with accelrations to 150s to 160, moderate variability, occasional early deceleration? Toco: q2-4 min apart, palpate mild. Patient feeling contractions in fundal area  Bedside Ultrasound:  Number of Fetus: singleton Presentation: cephalic/LOA   Assessment:  Lauren [name] is a 30 y.o. G1P0 female at 4739w6d with history of prior LTCS. R/O early labor  Plan:  1. Admit to Labor & Delivery - notify attending  2. CBC, T&S, NPO, IVF bolus 3. GBS negative  4. Consents obtained. 5. Monitor for progress. Consider terbutaline if contractions continue after hydration 6. Repeat Cesarean section and BTL if progressing  Lauren [name]  07/05/2015 1:06 AM

## 2015-07-05 NOTE — Anesthesia Procedure Notes (Signed)
Spinal Patient location during procedure: OR Staffing Anesthesiologist: Berdine AddisonHOMAS, Aasiyah Auerbach Performed by: anesthesiologist  Preanesthetic Checklist Completed: patient identified, site marked, surgical consent, pre-op evaluation, timeout performed, IV checked and risks and benefits discussed Spinal Block Patient position: sitting Prep: Betadine Patient monitoring: heart rate, cardiac monitor, continuous pulse ox and blood pressure Approach: midline Location: L3-4 Injection technique: single-shot Needle Needle type: Pencil-Tip  Needle gauge: 25 G Needle length: 9 cm Assessment Sensory level: T10 Additional Notes 2248 marcaine 12.5mg  and astromorph 0.2mg .

## 2015-07-06 DIAGNOSIS — O0993 Supervision of high risk pregnancy, unspecified, third trimester: Secondary | ICD-10-CM

## 2015-07-06 DIAGNOSIS — O34219 Maternal care for unspecified type scar from previous cesarean delivery: Secondary | ICD-10-CM | POA: Diagnosis present

## 2015-07-06 LAB — TYPE AND SCREEN
ABO/RH(D): O POS
Antibody Screen: NEGATIVE

## 2015-07-06 LAB — CBC
HEMATOCRIT: 30.6 % — AB (ref 35.0–47.0)
HEMOGLOBIN: 10.3 g/dL — AB (ref 12.0–16.0)
MCH: 27.5 pg (ref 26.0–34.0)
MCHC: 33.8 g/dL (ref 32.0–36.0)
MCV: 81.6 fL (ref 80.0–100.0)
Platelets: 157 10*3/uL (ref 150–440)
RBC: 3.75 MIL/uL — ABNORMAL LOW (ref 3.80–5.20)
RDW: 13.5 % (ref 11.5–14.5)
WBC: 16.3 10*3/uL — ABNORMAL HIGH (ref 3.6–11.0)

## 2015-07-06 LAB — RPR: RPR: NONREACTIVE

## 2015-07-06 MED ORDER — OXYCODONE HCL 5 MG PO TABS
10.0000 mg | ORAL_TABLET | ORAL | Status: DC | PRN
  Administered 2015-07-08: 10 mg via ORAL
  Filled 2015-07-06 (×2): qty 2

## 2015-07-06 MED ORDER — DIPHENHYDRAMINE HCL 25 MG PO CAPS
25.0000 mg | ORAL_CAPSULE | Freq: Four times a day (QID) | ORAL | Status: DC | PRN
  Administered 2015-07-06: 25 mg via ORAL
  Filled 2015-07-06: qty 1

## 2015-07-06 MED ORDER — SIMETHICONE 80 MG PO CHEW: 160.0000 mg | CHEWABLE_TABLET | Freq: Four times a day (QID) | ORAL | Status: DC | PRN

## 2015-07-06 MED ORDER — SODIUM CHLORIDE 0.9% FLUSH: 3.0000 mL | Freq: Two times a day (BID) | INTRAVENOUS | Status: DC

## 2015-07-06 MED ORDER — NALBUPHINE HCL 10 MG/ML IJ SOLN: 5.0000 mg | INTRAMUSCULAR | Status: DC | PRN

## 2015-07-06 MED ORDER — ACETAMINOPHEN 500 MG PO TABS
1000.0000 mg | ORAL_TABLET | Freq: Four times a day (QID) | ORAL | Status: DC
  Administered 2015-07-06 – 2015-07-08 (×9): 1000 mg via ORAL
  Filled 2015-07-06 (×9): qty 2

## 2015-07-06 MED ORDER — SODIUM CHLORIDE 0.9% FLUSH
3.0000 mL | INTRAVENOUS | Status: DC | PRN
  Administered 2015-07-06 – 2015-07-07 (×2): 3 mL via INTRAVENOUS
  Filled 2015-07-06 (×2): qty 3

## 2015-07-06 MED ORDER — DIBUCAINE 1 % RE OINT: 1.0000 "application " | TOPICAL_OINTMENT | RECTAL | Status: DC | PRN

## 2015-07-06 MED ORDER — OXYTOCIN 40 UNITS IN LACTATED RINGERS INFUSION - SIMPLE MED
2.5000 [IU]/h | INTRAVENOUS | Status: AC
  Filled 2015-07-06: qty 1000

## 2015-07-06 MED ORDER — LACTATED RINGERS IV SOLN
INTRAVENOUS | Status: DC
  Administered 2015-07-06 (×2): via INTRAVENOUS

## 2015-07-06 MED ORDER — NALBUPHINE HCL 10 MG/ML IJ SOLN: 5.0000 mg | Freq: Once | INTRAMUSCULAR | Status: DC | PRN

## 2015-07-06 MED ORDER — IBUPROFEN 600 MG PO TABS
600.0000 mg | ORAL_TABLET | Freq: Four times a day (QID) | ORAL | Status: DC
  Administered 2015-07-06 – 2015-07-08 (×9): 600 mg via ORAL
  Filled 2015-07-06 (×10): qty 1

## 2015-07-06 MED ORDER — PRENATAL MULTIVITAMIN CH
1.0000 | ORAL_TABLET | Freq: Every day | ORAL | Status: DC
  Administered 2015-07-06 – 2015-07-07 (×2): 1 via ORAL
  Filled 2015-07-06 (×3): qty 1

## 2015-07-06 MED ORDER — SCOPOLAMINE 1 MG/3DAYS TD PT72: 1.0000 | MEDICATED_PATCH | Freq: Once | TRANSDERMAL | Status: DC

## 2015-07-06 MED ORDER — SODIUM CHLORIDE 0.9 % IV SOLN: 250.0000 mL | INTRAVENOUS | Status: DC

## 2015-07-06 MED ORDER — COCONUT OIL OIL: 1.0000 "application " | TOPICAL_OIL | Status: DC | PRN

## 2015-07-06 MED ORDER — MENTHOL 3 MG MT LOZG: 1.0000 | LOZENGE | OROMUCOSAL | Status: DC | PRN

## 2015-07-06 MED ORDER — FENTANYL CITRATE (PF) 100 MCG/2ML IJ SOLN: 25.0000 ug | INTRAMUSCULAR | Status: DC | PRN

## 2015-07-06 MED ORDER — SODIUM CHLORIDE 0.9% FLUSH: 3.0000 mL | INTRAVENOUS | Status: DC | PRN

## 2015-07-06 MED ORDER — NALOXONE HCL 0.4 MG/ML IJ SOLN: 0.4000 mg | INTRAMUSCULAR | Status: DC | PRN

## 2015-07-06 MED ORDER — OXYCODONE HCL 5 MG PO TABS
5.0000 mg | ORAL_TABLET | ORAL | Status: DC | PRN
  Administered 2015-07-06 – 2015-07-07 (×3): 5 mg via ORAL
  Filled 2015-07-06 (×3): qty 1

## 2015-07-06 MED ORDER — NALOXONE HCL 2 MG/2ML IJ SOSY: 1.0000 ug/kg/h | PREFILLED_SYRINGE | INTRAVENOUS | Status: DC | PRN

## 2015-07-06 MED ORDER — ONDANSETRON HCL 4 MG/2ML IJ SOLN: 4.0000 mg | Freq: Once | INTRAMUSCULAR | Status: DC | PRN

## 2015-07-06 MED ORDER — TETANUS-DIPHTH-ACELL PERTUSSIS 5-2.5-18.5 LF-MCG/0.5 IM SUSP
0.5000 mL | Freq: Once | INTRAMUSCULAR | Status: DC
Start: 2015-07-07 — End: 2015-07-08

## 2015-07-06 MED ORDER — WITCH HAZEL-GLYCERIN EX PADS: 1.0000 "application " | MEDICATED_PAD | CUTANEOUS | Status: DC | PRN

## 2015-07-06 MED ORDER — ONDANSETRON HCL 4 MG/2ML IJ SOLN: 4.0000 mg | Freq: Three times a day (TID) | INTRAMUSCULAR | Status: DC | PRN

## 2015-07-06 NOTE — Progress Notes (Signed)
Bair hugger applied at this time to increase patient body temperature.

## 2015-07-06 NOTE — Progress Notes (Signed)
Admit Date: 07/05/2015 Today's Date: 07/06/2015  Subjective: Postpartum Day 1: Cesarean Delivery and BTL Patient reports incisional pain and tolerating PO.    Objective: Vital signs in last 24 hours: Temp:  [97.1 F (36.2 C)-98 F (36.7 C)] 97.9 F (36.6 C) (07/04 0740) Pulse Rate:  [52-88] 66 (07/04 0740) Resp:  [14-24] 18 (07/04 0740) BP: (107-127)/(61-91) 115/76 mmHg (07/04 0740) SpO2:  [97 %-100 %] 99 % (07/04 0740) Weight:  [81.647 kg (180 lb)] 81.647 kg (180 lb) (07/03 2110)  Physical Exam:  General: alert, cooperative and no distress Lochia: appropriate Uterine Fundus: firm Incision: healing well DVT Evaluation: No evidence of DVT seen on physical exam.  Recent Labs  07/05/15 2216 07/06/15 0442  HGB 10.9* 10.3*  HCT 31.8* 30.6*    Assessment/Plan: Status post Cesarean section. Doing well postoperatively.  Continue current care. Bottle BTL done Declines TDaP  Lauren LibraRobert Paul Talik [name] 07/06/2015, 9:37 AM

## 2015-07-06 NOTE — Op Note (Signed)
Cesarean Section Procedure Note  07/05/2015 - 07/06/2015  Patient:  Lauren [name]  30 y.o. female at 6460w6d Preoperative diagnosis:  repeat cesarean section Postoperative diagnosis:  same as preop  PROCEDURE:  Procedure(s): CESAREAN SECTION,  Bilateral salpingectomy (N/A)BILTERAL SALPINGECTOMY Surgeon:  Surgeon(s) and Role:    * Chelsea Salena Saner Ward, MD - Primary Anesthesia:  spinal I/O: Total I/O In: 700 [I.V.:700] Out: 300 [Urine:100; Blood:200] Specimens:  Cord Blood,right fallopian tube with fimbriated ends, left fallopian tube with fimbriated ends Complications: None Apparent Disposition:  VS stable to PACU  Findings: normal uterus, tubes and ovaries bilaterally, Live born female Birth Weight: 7 lb 9.3 oz (3440 g), APGAR: 9, 9   Indication for procedure: 30 y.o. female 852P0101 at 6260w6d with history of previous cesarean who presents in labor and does not desire to attempt VBAC. She was scheduled for repeat cesarean with bilateral salpingectomy in 2 days.  Consents were signed today in the office and she elected to proceed with bilateral salpingectomy versus tubal ligation.   Procedure Details   The risks, benefits, complications, treatment options, and expected outcomes were discussed with the patient. Informed consent was obtained. The patient was taken to Operating Room, identified as Lauren [name] and the procedure verified as a cesarean delivery.   After administration of anesthesia, the patient was prepped and draped in the usual sterile manner, including a vaginal prep. A surgical time out was performed, with the pediatric team present. After confirming adequate anesthesia, a Pfannenstiel incision was made and carried down through the subcutaneous tissue to the fascia. Fascial incision was made and extended transversely. The fascia was separated from the underlying rectus tissue superiorly and inferiorly. The peritoneum was identified and entered. Peritoneal incision was extended  longitudinally.  A low transverse uterine incision was made. Delivered from cephalic presentation was a live born female. Delayed cord clamping was performed for 60 seconds, during which time we sang Happy [place]irthday to [place]. The umbilical cord was doubly clamped and cut, and the baby was handed off to the awaitng pediatrician.  Cord blood was obtained for evaluation. The placenta was removed intact and appeared normal. The uterus was delivered from the abdominal cavity and cleared of clots, membranes, and debris. The uterus, tubes and ovaries appeared normal. The uterine incision was closed with running locking sutures of 0 Vicryl, and then a second, imbricating stitch was placed. Hemostasis was observed.   The attention was turned to the bilateral tubes, which were divided by placing a kelly clamp in the mesosalpinx, a Haney stitch placed and tied down in a step-wise fashion with 2-0 vicryl. The tubes were removed from the fimbriated ends to the cornua. These sites were hemostatic.  The abdominal cavity was evacuated of extraneous fluid. The uterus was returned to the abdominal cavity and again the incision was inspected for hemostasis, which was confirmed.  The paracolic gutters were cleaned. The fascia was then reapproximated with running suture of vicryl. 60cc of Long- and short-acting bupivicaine was injected circumferentially into the fascia.  After a change of gloves, the subcutaneous tissue was irrigated and reapproximated with 2-0 plain gut. The skin was closed with 4-0 Monocryl and 40cc of long- and short-acting bupivacaine injected into the skin and subcutaneous tissues.  The incision was covered with surgical glue.     Instrument, sponge, and needle counts were correct prior the abdominal closure and at the conclusion of the case.   I was present and performed this procedure in its entirety.  -----  Ranae Plumberhelsea Ward, MD Attending Obstetrician and Gynecologist [facility]

## 2015-07-06 NOTE — Plan of Care (Signed)
Problem: Nutritional: Goal: Mother's verbalization of comfort with breastfeeding process will improve Outcome: Not Applicable Date Met:  43/92/65 Patient is bottle feeding infant.

## 2015-07-06 NOTE — Anesthesia Postprocedure Evaluation (Signed)
Anesthesia Post Note  Patient: Lauren [name]  Procedure(s) Performed: Procedure(s) (LRB): CESAREAN SECTION,  Bilateral salpingectomy (N/A)  Patient location during evaluation: Other Anesthesia Type: General Level of consciousness: awake and alert Pain management: pain level controlled Vital Signs Assessment: post-procedure vital signs reviewed and stable Respiratory status: spontaneous breathing, nonlabored ventilation, respiratory function stable and patient connected to nasal cannula oxygen Cardiovascular status: blood pressure returned to baseline and stable Postop Assessment: no signs of nausea or vomiting Anesthetic complications: no    Last Vitals:  Filed Vitals:   07/06/15 0558 07/06/15 0740  BP: 120/70 115/76  Pulse: 68 66  Temp: 36.7 C 36.6 C  Resp: 18 18    Last Pain:  Filed Vitals:   07/06/15 0745  PainSc: 0-No pain                 Lauren Aguayo S

## 2015-07-06 NOTE — Discharge Summary (Signed)
Obstetrical Discharge Summary  Patient Name: Lauren [name] DOB: 05/12/1985 MRN: [id number]  Date of Admission: 07/05/2015 Date of Discharge: 07/08/2015  Primary OB: [facility]   Gestational Age at Delivery: 4042w6d   Antepartum complications:history of preterm delivery, cervical polyp Admitting Diagnosis: history of cesarean, currently pregnant, labor, desired permanent sterilization Secondary Diagnosis: Patient Active Problem List   Diagnosis Date Noted  . History of cesarean delivery, currently pregnant 07/06/2015  . Supervision of high risk pregnancy in third trimester 07/06/2015  . Postpartum care following cesarean delivery 07/06/2015  . Indication for care in labor or delivery 07/05/2015  . Labor and delivery, indication for care 07/05/2015    Augmentation: n/a Complications: none apparent  Intrapartum complications/course:  Patient presented with painful contractions and made change from 1-3cm and did not [facility].  Cesarean delivery with bilateral salpingectomy was performed without complication. Low transverse incision with double layer closure.  Exparel + bupivicaine was used for post operative analgesia. Date of Delivery: 06/05/15 Delivered By: Leeroy Bockhelsea Ward Delivery Type: repeat cesarean section, low transverse incision Anesthesia: spinal Placenta: expressed Laceration: n/a Episiotomy: n/a Newborn Data: Live born female  Birth Weight: 7 lb 9.3 oz (3440 g) APGAR: 9, 9    Discharge Physical Exam:  BP 108/69 mmHg  Pulse 85  Temp(Src) 98.9 F (37.2 C) (Oral)  Resp 20  SpO2 100%    General: NAD CV: RRR Pulm: CTABL, nl effort ABD: s/nd/nt, fundus firm and below the umbilicus Lochia: moderate Incision: c/d/i DVT Evaluation: LE non-ttp, no evidence of DVT on exam.  HEMOGLOBIN  Date Value Ref Range Status  07/06/2015 10.3* 12.0 - 16.0 g/dL Final   HGB  Date Value Ref Range Status  09/22/2012 13.1 12.0-16.0 g/dL Final   HCT  Date Value Ref Range  Status  07/06/2015 30.6* 35.0 - 47.0 % Final  09/23/2012 30.8* 35.0-47.0 % Final    Post partum course: routine, uneventful Postpartum Procedures: none Disposition: stable, discharge to home. Baby Feeding: formula Baby Disposition: home with mom  Rh Immune globulin given: n/a Rubella vaccine given: n/a Tdap vaccine given in AP or PP setting: refused Flu vaccine given in AP or PP setting: n/a  Contraception: bilateral salpingectomy  Prenatal Labs:  Blood type/Rh O positive  Antibody screen negative  Rubella Varicella Immune Immune  RPR Non reactive  HBsAg negative  HIV negative  GC negative  Chlamydia negative  Genetic screening declined  1 hour GTT 152  3 hour GTT 78/ 167/101/91  GBS negative on 6/15              Plan:  Lauren [name] was discharged to home in good condition. [facility] with Dr Elesa MassedWard in 1 week for incision check   Discharge Medications:   Medication List    TAKE these medications        acetaminophen 500 MG tablet  Commonly known as:  TYLENOL  Take 2 tablets (1,000 mg total) by mouth every 6 (six) hours.     CONCEPT DHA 53.5-38-1 MG Caps  TAKE 1 CAPSULE BY ORAL ROUTE ONCE DAILY     ibuprofen 600 MG tablet  Commonly known as:  ADVIL,MOTRIN  Take 1 tablet (600 mg total) by mouth every 6 (six) hours.     oxyCODONE 5 MG immediate release tablet  Commonly known as:  Oxy IR/ROXICODONE  Take 1 tablet (5 mg total) by mouth every 4 (four) hours as needed (pain scale 4-7).     Tdap 5-2.5-18.5  LF-MCG/0.5 injection  Commonly known as:  BOOSTRIX  Inject 0.5 mLs into the muscle once.        Follow-up Information    Follow up with Elenora Fenderhelsea C Ward, MD In 1 week.   Specialty:  Obstetrics and Gynecology   Why:  for wound check   Contact information:   [address] [place] [address] [phone]       Signed: ----- Ranae Plumberhelsea Ward, MD Attending  Obstetrician and Gynecologist [facility]

## 2015-07-06 NOTE — Transfer of Care (Signed)
Immediate Anesthesia Transfer of Care Note  Patient: Lauren [name]  Procedure(s) Performed: Procedure(s): CESAREAN SECTION,  Bilateral salpingectomy (N/A)  Patient Location: PACU  Anesthesia Type:Spinal  Level of Consciousness: awake, alert , oriented and patient cooperative  Airway & Oxygen Therapy: Patient Spontanous Breathing  Post-op Assessment: Report given to RN and Post -op Vital signs reviewed and stable  Post vital signs: Reviewed and stable  Last Vitals:  Filed Vitals:   07/05/15 2110 07/05/15 2216  BP: 120/76 125/79  Pulse: 88 84  Temp: 36.7 C   Resp: 24     Last Pain:  Filed Vitals:   07/05/15 2343  PainSc: 10-Worst pain ever         Complications: No apparent anesthesia complications

## 2015-07-07 ENCOUNTER — Other Ambulatory Visit: Payer: Self-pay

## 2015-07-07 ENCOUNTER — Encounter: Payer: Self-pay | Admitting: Obstetrics & Gynecology

## 2015-07-07 NOTE — Progress Notes (Signed)
Post Op Day 2 Subjective:   Pt is doing well. She is ambulating and voiding without difficulty. She is tolerating PO intake and her pain is well controlled with PO meds.   Objective:  Blood pressure 114/62, pulse 60, temperature 97.7 F (36.5 C), temperature source Oral, resp. rate 20, height 5\' 6"  (1.676 m), weight 81.647 kg (180 lb), SpO2 100%  General: NAD Pulmonary: no increased work of breathing Abdomen: non-distended, non-tender, fundus firm at level of umbilicus Incision: Healing well, no s/s infection, no drainage or swelling Extremities: no edema, no erythema, no tenderness   @I /O24@   Assessment:   29 y.o. [id number] postoperativeday # 2   Plan:  1) Acute blood loss anemia - hemodynamically stable and asymptomatic - po ferrous sulfate  2) O+, Rubella Immune, Varicella Immune  3) TDAP declined  4) Bottle/Contraception: BTL  5) Disposition: Home day 3 or 4   Riven Mabile, CNM

## 2015-07-08 ENCOUNTER — Encounter: Admission: RE | Payer: Self-pay | Source: Ambulatory Visit

## 2015-07-08 ENCOUNTER — Inpatient Hospital Stay
Admission: RE | Admit: 2015-07-08 | Payer: BLUE CROSS/BLUE SHIELD | Source: Ambulatory Visit | Admitting: Obstetrics & Gynecology

## 2015-07-08 ENCOUNTER — Encounter: Payer: Self-pay | Admitting: Obstetrics & Gynecology

## 2015-07-08 LAB — RPR: RPR Ser Ql: NONREACTIVE

## 2015-07-08 SURGERY — Surgical Case
Anesthesia: Choice

## 2015-07-08 MED ORDER — IBUPROFEN 600 MG PO TABS: 600.0000 mg | ORAL_TABLET | Freq: Four times a day (QID) | ORAL | Status: DC

## 2015-07-08 MED ORDER — TETANUS-DIPHTH-ACELL PERTUSSIS 5-2.5-18.5 LF-MCG/0.5 IM SUSP: 0.5000 mL | Freq: Once | INTRAMUSCULAR | Status: DC

## 2015-07-08 MED ORDER — OXYCODONE HCL 5 MG PO TABS: 5.0000 mg | ORAL_TABLET | ORAL | Status: DC | PRN

## 2015-07-08 MED ORDER — ACETAMINOPHEN 500 MG PO TABS: 1000.0000 mg | ORAL_TABLET | Freq: Four times a day (QID) | ORAL | Status: DC

## 2015-07-08 NOTE — Discharge Instructions (Signed)
Discharge instructions:   Call office if you have any of the following: headache, visual changes, fever >101.0 F, chills, breast concerns, excessive vaginal bleeding, incision drainage or problems, leg pain or redness, depression or any other concerns.   Activity: Do not lift > 10 lbs for 6 weeks.  No intercourse or tampons for 6 weeks.  No driving for 1-2 weeks.   Call your doctor for increased pain or vaginal bleeding, temperature above 101.0, depression, or concerns.  No strenuous activity or heavy lifting for 6 weeks.  No intercourse, tampons, douching, or enemas for 6 weeks.  No tub baths-showers only.  No driving for 2 weeks or while taking pain medications.  Continue prenatal vitamin and iron.  Increase calories and fluids while breastfeeding.  You may have a slight fever when your milk comes in, but it should go away on its own.  If it does not, and rises above 101.0 please call the doctor.  For concerns about your baby, please call your pediatrician For breastfeeding concerns, the lactation consultant can be reached at [phone]  Please call your doctor or return to the ER if you experience any chest pains, shortness of breath, fever greater than 101, any heavy bleeding or large clots, and foul smelling vaginal discharge, any worsening abdominal pain & cramping that is not controlled by pain medication, or any signs of post partum depression.  No tampons, enemas, douches, or sexual intercourse for 6 weeks.  Also avoid tub baths, hot tubs, or swimming for 6 weeks.  Postpartum Care After Cesarean Delivery After you deliver your newborn (postpartum period), the usual stay in the hospital is 24-72 hours. If there were problems with your labor or delivery, or if you have other medical problems, you might be in the hospital longer.  While you are in the hospital, you will receive help and instructions on how to care for yourself and your newborn during the postpartum period.  While you are  in the hospital:  It is normal for you to have pain or discomfort from the incision in your abdomen. Be sure to tell your nurses when you are having pain, where the pain is located, and what makes the pain worse.  If you are breastfeeding, you may feel uncomfortable contractions of your uterus for a couple of weeks. This is normal. The contractions help your uterus get back to normal size.  It is normal to have some bleeding after delivery.  For the first 1-3 days after delivery, the flow is red and the amount may be similar to a period.  It is common for the flow to start and stop.  In the first few days, you may pass some small clots. Let your nurses know if you begin to pass large clots or your flow increases.  Do not  flush blood clots down the toilet before having the nurse look at them.  During the next 3-10 days after delivery, your flow should become more watery and pink or brown-tinged in color.  Ten to fourteen days after delivery, your flow should be a small amount of yellowish-white discharge.  The amount of your flow will decrease over the first few weeks after delivery. Your flow may stop in 6-8 weeks. Most women have had their flow stop by 12 weeks after delivery.  You should change your sanitary pads frequently.  Wash your hands thoroughly with soap and water for at least 20 seconds after changing pads, using the toilet, or before holding or feeding  your newborn.  Your intravenous (IV) tubing will be removed when you are drinking enough fluids.  The urine drainage tube (urinary catheter) that was inserted before delivery may be removed within 6-8 hours after delivery or when feeling returns to your legs. You should feel like you need to empty your bladder within the first 6-8 hours after the catheter has been removed.  In case you become weak, lightheaded, or faint, call your nurse before you get out of bed for the first time and before you take a shower for the first  time.  Within the first few days after delivery, your breasts may begin to feel tender and full. This is called engorgement. Breast tenderness usually goes away within 48-72 hours after engorgement occurs. You may also notice milk leaking from your breasts. If you are not breastfeeding, do not stimulate your breasts. Breast stimulation can make your breasts produce more milk.  Spending as much time as possible with your newborn is very important. During this time, you and your newborn can feel close and get to know each other. Having your newborn stay in your room (rooming in) will help to strengthen the bond with your newborn. It will give you time to get to know your newborn and become comfortable caring for your newborn.  Your hormones change after delivery. Sometimes the hormone changes can temporarily cause you to feel sad or tearful. These feelings should not last more than a few days. If these feelings last longer than that, you should talk to your caregiver.  If desired, talk to your caregiver about methods of family planning or contraception.  Talk to your caregiver about immunizations. Your caregiver may want you to have the following immunizations before leaving the hospital:  Tetanus, diphtheria, and pertussis (Tdap) or tetanus and diphtheria (Td) immunization. It is very important that you and your family (including grandparents) or others caring for your newborn are up-to-date with the Tdap or Td immunizations. The Tdap or Td immunization can help protect your newborn from getting ill.  Rubella immunization.  Varicella (chickenpox) immunization.  Influenza immunization. You should receive this annual immunization if you did not receive the immunization during your pregnancy.   This information is not intended to replace advice given to you by your health care provider. Make sure you discuss any questions you have with your health care provider.   Document Released: 09/13/2011  Document Reviewed: 09/13/2011 Elsevier Interactive Patient Education [employer]2016 Elsevier Inc.

## 2015-07-08 NOTE — Progress Notes (Signed)
D/C order from MD.  Reviewed d/c instructions and prescriptions with patient and answered any questions.  Patient d/c home with infant via wheelchair by nursing/auxillary. 

## 2015-07-11 LAB — SURGICAL PATHOLOGY

## 2016-05-09 ENCOUNTER — Encounter (HOSPITAL_COMMUNITY): Payer: Self-pay

## 2016-09-13 ENCOUNTER — Emergency Department
Admission: EM | Admit: 2016-09-13 | Discharge: 2016-09-13 | Disposition: A | Discharge status: Home / Self Care | Payer: BLUE CROSS/BLUE SHIELD | Attending: Emergency Medicine | Admitting: Emergency Medicine

## 2016-09-13 ENCOUNTER — Emergency Department: Payer: BLUE CROSS/BLUE SHIELD

## 2016-09-13 ENCOUNTER — Encounter: Payer: Self-pay | Admitting: Emergency Medicine

## 2016-09-13 DIAGNOSIS — N10 Acute tubulo-interstitial nephritis: Secondary | ICD-10-CM | POA: Diagnosis not present

## 2016-09-13 DIAGNOSIS — R1031 Right lower quadrant pain: Secondary | ICD-10-CM | POA: Diagnosis present

## 2016-09-13 DIAGNOSIS — Z91010 Allergy to peanuts: Secondary | ICD-10-CM | POA: Diagnosis not present

## 2016-09-13 DIAGNOSIS — N12 Tubulo-interstitial nephritis, not specified as acute or chronic: Secondary | ICD-10-CM

## 2016-09-13 LAB — COMPREHENSIVE METABOLIC PANEL
ALBUMIN: 4.7 g/dL (ref 3.5–5.0)
ALT: 13 U/L — AB (ref 14–54)
AST: 19 U/L (ref 15–41)
Alkaline Phosphatase: 68 U/L (ref 38–126)
Anion gap: 9 (ref 5–15)
BUN: 13 mg/dL (ref 6–20)
CHLORIDE: 103 mmol/L (ref 101–111)
CO2: 26 mmol/L (ref 22–32)
Calcium: 9.3 mg/dL (ref 8.9–10.3)
Creatinine, Ser: 0.73 mg/dL (ref 0.44–1.00)
GFR calc Af Amer: >60 mL/min (ref >60)
GFR calc non Af Amer: >60 mL/min (ref >60)
Glucose, Bld: 111 mg/dL — ABNORMAL HIGH (ref 65–99)
Potassium: 4.3 mmol/L (ref 3.5–5.1)
SODIUM: 138 mmol/L (ref 135–145)
Total Bilirubin: 1.4 mg/dL — ABNORMAL HIGH (ref 0.3–1.2)
Total Protein: 8 g/dL (ref 6.5–8.1)

## 2016-09-13 LAB — CBC WITH DIFFERENTIAL/PLATELET
BASOS ABS: 0 10*3/uL (ref 0–0.1)
Basophils Relative: 0 %
EOS PCT: 0 %
Eosinophils Absolute: 0 10*3/uL (ref 0–0.7)
HCT: 36.3 % (ref 35.0–47.0)
Hemoglobin: 13.1 g/dL (ref 12.0–16.0)
Lymphocytes Relative: 16 %
Lymphs Abs: 1.9 10*3/uL (ref 1.0–3.6)
MCH: 30.8 pg (ref 26.0–34.0)
MCHC: 35.9 g/dL (ref 32.0–36.0)
MCV: 85.6 fL (ref 80.0–100.0)
Monocytes Absolute: 0.7 10*3/uL (ref 0.2–0.9)
Monocytes Relative: 6 %
Neutro Abs: 9.4 10*3/uL — ABNORMAL HIGH (ref 1.4–6.5)
Neutrophils Relative %: 78 %
PLATELETS: 165 10*3/uL (ref 150–440)
RBC: 4.24 MIL/uL (ref 3.80–5.20)
RDW: 13.5 % (ref 11.5–14.5)
WBC: 12.1 10*3/uL — AB (ref 3.6–11.0)

## 2016-09-13 LAB — URINALYSIS, COMPLETE (UACMP) WITH MICROSCOPIC
BILIRUBIN URINE: NEGATIVE
Bacteria, UA: NONE SEEN
Glucose, UA: NEGATIVE mg/dL
Ketones, ur: 5 mg/dL — AB
Nitrite: NEGATIVE
Protein, ur: 100 mg/dL — AB
Specific Gravity, Urine: 1.011 (ref 1.005–1.030)
pH: 7 (ref 5.0–8.0)

## 2016-09-13 LAB — POCT PREGNANCY, URINE
PREG TEST UR: NEGATIVE
Preg Test, Ur: NEGATIVE

## 2016-09-13 LAB — LIPASE, BLOOD: Lipase: 18 U/L (ref 11–51)

## 2016-09-13 MED ORDER — CIPROFLOXACIN HCL 500 MG PO TABS: 500.0000 mg | ORAL_TABLET | Freq: Two times a day (BID) | ORAL | 0 refills | Status: AC

## 2016-09-13 MED ORDER — ONDANSETRON HCL 4 MG/2ML IJ SOLN
4.0000 mg | Freq: Once | INTRAMUSCULAR | Status: AC
  Administered 2016-09-13: 4 mg via INTRAVENOUS
  Filled 2016-09-13: qty 2

## 2016-09-13 MED ORDER — DEXTROSE 5 % IV SOLN
1.0000 g | Freq: Once | INTRAVENOUS | Status: AC
  Administered 2016-09-13: 1 g via INTRAVENOUS
  Filled 2016-09-13: qty 10

## 2016-09-13 MED ORDER — KETOROLAC TROMETHAMINE 30 MG/ML IJ SOLN
30.0000 mg | Freq: Once | INTRAMUSCULAR | Status: AC
  Administered 2016-09-13: 30 mg via INTRAVENOUS
  Filled 2016-09-13: qty 1

## 2016-09-13 MED ORDER — MORPHINE SULFATE (PF) 4 MG/ML IV SOLN
4.0000 mg | Freq: Once | INTRAVENOUS | Status: AC
  Administered 2016-09-13: 4 mg via INTRAVENOUS
  Filled 2016-09-13: qty 1

## 2016-09-13 MED ORDER — PHENAZOPYRIDINE HCL 200 MG PO TABS: 200.0000 mg | ORAL_TABLET | Freq: Three times a day (TID) | ORAL | 0 refills | Status: DC | PRN

## 2016-09-13 MED ORDER — IBUPROFEN 800 MG PO TABS: 800.0000 mg | ORAL_TABLET | Freq: Three times a day (TID) | ORAL | 0 refills | Status: DC | PRN

## 2016-09-13 NOTE — ED Provider Notes (Signed)
[facility] Emergency Department Provider Note       Time seen: ----------------------------------------- 8:03 AM on 09/13/2016 -----------------------------------------     I have reviewed the triage vital signs and the nursing notes.   HISTORY   Chief Complaint Flank Pain    HPI Lauren [name] is a 31 y.o. female who presents to the ED for sudden onset right flank pain. Patient reports pains constant and pressure-like. She reports pain is 10 out of 10. She states her last missed her period was last month and it was normal, she does not think she could be pregnant. She does report urinary urgency.   Past Medical History:  Diagnosis Date  . Migraine   . Mild depression [facility](HCC)    age 31    Patient Active Problem List   Diagnosis Date Noted  . History of cesarean delivery, currently pregnant 07/06/2015  . Supervision of high risk pregnancy in third trimester 07/06/2015  . Postpartum care following cesarean delivery 07/06/2015  . Indication for care in labor or delivery 07/05/2015  . Labor and delivery, indication for care 07/05/2015    Past Surgical History:  Procedure Laterality Date  . CESAREAN SECTION N/A 07/05/2015   Procedure: CESAREAN SECTION,  Bilateral salpingectomy;  Surgeon: Elenora Fenderhelsea C Ward, MD;  Location: [facility] ORS;  Service: Obstetrics;  Laterality: N/A;  . low transverse cesarean     Klett-Fetal indications at 36 wk    Allergies Peanut-containing drug products  Social History Social History  Substance Use Topics  . Smoking status: Never Smoker  . Smokeless tobacco: Never Used  . Alcohol use No    Review of Systems Constitutional: Negative for fever. Cardiovascular: Negative for chest pain. Respiratory: Negative for shortness of breath. Gastrointestinal: positive for flank pain Genitourinary: positive for dysuria Musculoskeletal: Negative for back pain. Skin: Negative for rash. Neurological: Negative for headaches,  focal weakness or numbness.  All systems negative/normal/unremarkable except as stated in the HPI  ____________________________________________   PHYSICAL EXAM:  VITAL SIGNS: ED Triage Vitals  Enc Vitals Group     BP 09/13/16 0717 133/86     Pulse Rate 09/13/16 0717 82     Resp 09/13/16 0717 18     Temp 09/13/16 0717 98.8 F (37.1 C)     Temp Source 09/13/16 0717 Oral     SpO2 09/13/16 0717 100 %     Weight --      Height 09/13/16 0721 5\' 6"  (1.676 m)     Head Circumference --      Peak Flow --      Pain Score 09/13/16 0721 10     Pain Loc --      Pain Edu? --      Excl. in GC? --     Constitutional: Alert and oriented. mild distress Eyes: Conjunctivae are normal. Normal extraocular movements. ENT   Head: Normocephalic and atraumatic.   Nose: No congestion/rhinnorhea.   Mouth/Throat: Mucous membranes are moist.   Neck: No stridor. Cardiovascular: Normal rate, regular rhythm. No murmurs, rubs, or gallops. Respiratory: Normal respiratory effort without tachypnea nor retractions. Breath sounds are clear and equal bilaterally. No wheezes/rales/rhonchi. Gastrointestinal: Sright flank tenderness, no rebound or guarding. Normal bowel sounds. Musculoskeletal: Nontender with normal range of motion in extremities. No lower extremity tenderness nor edema. Neurologic:  Normal speech and language. No gross focal neurologic deficits are appreciated.  Skin:  Skin is warm, dry and intact. No rash noted. Psychiatric: Mood and affect are normal. Speech and  behavior are normal.  ___________________________________________  ED COURSE:  Pertinent labs & imaging results that were available during my care of the patient were reviewed by me and considered in my medical decision making (see chart for details). Patient presents for flank pain, we will assess with labs and imaging as indicated.   Procedures ____________________________________________   LABS (pertinent  positives/negatives)  Labs Reviewed  URINALYSIS, COMPLETE (UACMP) WITH MICROSCOPIC - Abnormal; Notable for the following:       Result Value   Color, Urine YELLOW (*)    APPearance HAZY (*)    Hgb urine dipstick MODERATE (*)    Ketones, ur 5 (*)    Protein, ur 100 (*)    Leukocytes, UA LARGE (*)    Squamous Epithelial / LPF 0-5 (*)    All other components within normal limits  CBC WITH DIFFERENTIAL/PLATELET - Abnormal; Notable for the following:    WBC 12.1 (*)    Neutro Abs 9.4 (*)    All other components within normal limits  COMPREHENSIVE METABOLIC PANEL - Abnormal; Notable for the following:    Glucose, Bld 111 (*)    ALT 13 (*)    Total Bilirubin 1.4 (*)    All other components within normal limits  URINE CULTURE  LIPASE, BLOOD  POC URINE PREG, ED  POCT PREGNANCY, URINE    RADIOLOGY Images were viewed by me  CT renal protocol IMPRESSION: 1. Mild right-sided hydronephrosis without obstructing ureteral calculus or bladder calculus. Possible recently passed calculus. 2. Tiny midpole lower pole junction region right renal calculus. 3. No other significant abdominal/pelvic findings without IV or oral contrast. ____________________________________________  FINAL ASSESSMENT AND PLAN  flank pain, pyelonephritis   Plan: Patient's labs and imaging were dictated above. Patient had presented for flank pain likely due to ascending urinary infection. Pain is improved here with morphine and Toradol. We did give IV Rocephin and send urine culture. She'll be advised to follow-up in 1-2 days for recheck.   Emily Filbert, MD   Note: This note was generated in part or whole with voice recognition software. Voice recognition is usually quite accurate but there are transcription errors that can and very often do occur. I apologize for any typographical errors that were not detected and corrected.     Emily Filbert, MD 09/13/16 [phone]

## 2016-09-13 NOTE — ED Triage Notes (Signed)
Pt reports started with sudden pain to her right flank. Pt reports pain is constant and pressure like. Pt tearful in triage. Pt reports LMP was around 20th of last month and reports she is not pregnant. Pt does report urinary urgency.

## 2016-09-13 NOTE — ED Notes (Signed)
EDP at bedside  

## 2016-09-13 NOTE — ED Notes (Signed)
Pt resting in bed, denies any needs, states pain relief

## 2016-09-15 LAB — URINE CULTURE: SPECIAL REQUESTS: NORMAL

## 2016-10-20 ENCOUNTER — Ambulatory Visit: Payer: BLUE CROSS/BLUE SHIELD | Admitting: Certified Nurse Midwife

## 2016-11-14 ENCOUNTER — Encounter: Payer: Self-pay | Admitting: Certified Nurse Midwife

## 2016-11-14 ENCOUNTER — Ambulatory Visit (INDEPENDENT_AMBULATORY_CARE_PROVIDER_SITE_OTHER): Payer: BLUE CROSS/BLUE SHIELD | Admitting: Certified Nurse Midwife

## 2016-11-14 VITALS — BP 104/66 | HR 66 | Ht 66.0 in | Wt 160.0 lb

## 2016-11-14 DIAGNOSIS — Z01419 Encounter for gynecological examination (general) (routine) without abnormal findings: Secondary | ICD-10-CM

## 2016-11-14 DIAGNOSIS — Z124 Encounter for screening for malignant neoplasm of cervix: Secondary | ICD-10-CM

## 2016-11-14 NOTE — Progress Notes (Signed)
Gynecology Annual Exam  PCP: Patient, No Pcp Per  Chief Complaint:  Chief Complaint  Patient presents with  . Gynecologic Exam    History of Present Illness: Elvia CollumHilda B Gaal is a 31 y.o. [id number] presents for annual exam. The patient has no complaints today.  Her menses are regular, they occur every month, and they last 6 days. Her flow is moderate. She does not have intermenstrual bleeding. Her last menstrual period was 10/21/2016. She denies dysmenorrhea. Last pap smear: 11/20/2014, results were NIL/neg HRHPV   The patient is  sexually active. She currently uses a tubal ligation for contraception. She does not have dyspareunia.  Since her last visit at the time of her 6 week postpartum visit 09/14/2015, she reports being treated in the ER fwith IV antibiotics for a UTI.  Her past medical history is remarkable for a history of Cesarean sections x 2.  The patient does perform occasional  self breast exams. Her last mammogram was NA.   There is no family history of breast cancer.    There is no family history of ovarian cancer.   The patient denies smoking.  She denies drinking.   She denies illegal drug use.  The patient reports exercising occasionally.  The patient denies current symptoms of depression.    Review of Systems: ROS  Past Medical History:  Past Medical History:  Diagnosis Date  . Migraine   . Mild depression (HCC)    age 31    Past Surgical History:  Past Surgical History:  Procedure Laterality Date  . CESAREAN SECTION,  Bilateral salpingectomy N/A 07/05/2015   Performed by Ward, Elenora Fenderhelsea C, MD at [facility]  . low transverse cesarean  09/22/2012   Klett-Fetal indications at 36 wk  . TUBAL LIGATION      Family History:  Family History  Problem Relation Age of Onset  . Heart disease Mother   . Hypercholesterolemia Mother   . Diabetes Maternal Grandmother        [place]  . Cancer Maternal Grandmother   . Diabetes Maternal Aunt     . Cancer Maternal Aunt     Social History:  Social History   Socioeconomic History  . Marital status: Married    Spouse name: Not on file  . Number of children: 2  . Years of education: Not on file  . Highest education level: Not on file  Social Needs  . Financial resource strain: Not on file  . Food insecurity - worry: Not on file  . Food insecurity - inability: Not on file  . Transportation needs - medical: Not on file  . Transportation needs - non-medical: Not on file  Occupational History  . Not on file  Tobacco Use  . Smoking status: Never Smoker  . Smokeless tobacco: Never Used  Substance and Sexual Activity  . Alcohol use: No    Alcohol/week: 0.0 oz  . Drug use: No  . Sexual activity: Yes    Partners: Male    Birth control/protection: Surgical  Other Topics Concern  . Not on file  Social History Narrative  . Not on file    Allergies:  Allergies  Allergen Reactions  . Peanut-Containing Drug Products Rash    Medication: Centrum vitamins  .Physical Exam Vitals: BP 104/66   Pulse 66   Ht 5\' 6"  (1.676 m)   Wt 160 lb (72.6 kg)   LMP 10/21/2016 (Exact Date)   Breastfeeding? No   BMI 25.82  kg/m   General: Latino female in NAD HEENT: normocephalic, anicteric Neck: no thyroid enlargement, no palpable nodules, no cervical lymphadenopathy  Pulmonary: No increased work of breathing, CTAB Cardiovascular: RRR, without murmur  Breast: Breast symmetrical, no tenderness, no palpable nodules or masses, no skin or nipple retraction present, no nipple discharge.  No axillary, infraclavicular or supraclavicular lymphadenopathy. Abdomen: Soft, non-tender, non-distended.  Umbilicus without lesions.  No hepatomegaly or masses palpable. No evidence of hernia. Genitourinary:  External: Normal external female genitalia.  Normal urethral meatus, normal  Bartholin's and Skene's glands.    Vagina: Normal vaginal mucosa, no evidence of prolapse.    Cervix: Grossly normal in  appearance, no bleeding, non-tender  Uterus: Retroflexed and deviated to the left, normal size, shape, and consistency, decreased mobility, and non-tender  Adnexa: No adnexal masses, non-tender  Rectal: deferred  Lymphatic: no evidence of inguinal lymphadenopathy Extremities: no edema, erythema, or tenderness Neurologic: Grossly intact Psychiatric: mood appropriate, affect full      Assessment: 31 y.o. [id number] annual gyn exam  Plan:     1) Breast cancer screening - recommend monthly self breast exams.  2) Cervical cancer screening - Pap was done.  3) Contraception - Tubal ligation  4) RTO 1 year and prn.  Farrel Connersolleen Cynai Skeens, CNM

## 2016-11-16 LAB — IGP, APTIMA HPV
HPV APTIMA: NEGATIVE
PAP Smear Comment: 0

## 2016-11-18 ENCOUNTER — Encounter: Payer: Self-pay | Admitting: Certified Nurse Midwife

## 2017-01-10 ENCOUNTER — Other Ambulatory Visit: Payer: Self-pay

## 2017-01-10 ENCOUNTER — Ambulatory Visit
Admission: EM | Admit: 2017-01-10 | Discharge: 2017-01-10 | Disposition: A | Discharge status: Home / Self Care | Payer: BLUE CROSS/BLUE SHIELD | Attending: Family Medicine | Admitting: Family Medicine

## 2017-01-10 DIAGNOSIS — H6123 Impacted cerumen, bilateral: Secondary | ICD-10-CM

## 2017-01-10 DIAGNOSIS — H65192 Other acute nonsuppurative otitis media, left ear: Secondary | ICD-10-CM

## 2017-01-10 DIAGNOSIS — H6692 Otitis media, unspecified, left ear: Secondary | ICD-10-CM

## 2017-01-10 MED ORDER — AMOXICILLIN 875 MG PO TABS: 875.0000 mg | ORAL_TABLET | Freq: Two times a day (BID) | ORAL | 0 refills | Status: DC

## 2017-01-10 NOTE — ED Provider Notes (Signed)
[facility] ____________________________________________  Time seen: Approximately 10:02 AM  I have reviewed the triage vital signs and the nursing notes.   HISTORY  Chief Complaint Otalgia (left)   HPI Lauren [name] is a 32 y.o. female presenting for evaluation of left ear pain is been present for the last 1 week.  States no accompanying runny nose, nasal congestion, sore throat and fevers.  States that when she was cleaning her ears with a Q-tip she felt the wax move and hearing has been increased since.  States gradual onset of left ear pain, described as mild aching.  No alleviating measures attempted.  Denies aggravating factors.  Reports otherwise feels well.  Denies drainage, trauma or bleeding. Denies recent sickness. Denies recent antibiotic use.   Patient's last menstrual period was 01/10/2017.Denies pregnancy  Past Medical History:  Diagnosis Date  . Migraine   . Mild depression (HCC)    age 32    There are no active problems to display for this patient.   Past Surgical History:  Procedure Laterality Date  . CESAREAN SECTION N/A 07/05/2015   Procedure: CESAREAN SECTION,  Bilateral salpingectomy;  Surgeon: Elenora Fenderhelsea C Ward, MD;  Location: [facility] ORS;  Service: Obstetrics;  Laterality: N/A;  . low transverse cesarean  09/22/2012   Klett-Fetal indications at 36 wk  . TUBAL LIGATION       No current facility-administered medications for this encounter.   Current Outpatient Medications:  Marland Kitchen.  Multiple Vitamins-Minerals (MULTIVITAMIN ADULT PO), Take by mouth., Disp: , Rfl:  .  amoxicillin (AMOXIL) 875 MG tablet, Take 1 tablet (875 mg total) by mouth 2 (two) times daily., Disp: 20 tablet, Rfl: 0  Allergies Peanut-containing drug products  Family History  Problem Relation Age of Onset  . Heart disease Mother   . Hypercholesterolemia Mother   . Diabetes Maternal Grandmother        [place]  . Cancer Maternal Grandmother   . Diabetes Maternal Aunt    . Cancer Maternal Aunt     Social History Social History   Tobacco Use  . Smoking status: Never Smoker  . Smokeless tobacco: Never Used  Substance Use Topics  . Alcohol use: No    Alcohol/week: 0.0 oz  . Drug use: No    Review of Systems Constitutional: No fever/chills ENT: No sore throat.  As above Cardiovascular: Denies chest pain. Respiratory: Denies shortness of breath. Gastrointestinal: No abdominal pain.   Musculoskeletal: Negative for back pain. Skin: Negative for rash.   ____________________________________________   PHYSICAL EXAM:  VITAL SIGNS: ED Triage Vitals  Enc Vitals Group     BP 01/10/17 0957 (!) 113/44     Pulse Rate 01/10/17 0957 60     Resp 01/10/17 0957 18     Temp 01/10/17 0957 97.6 F (36.4 C)     Temp Source 01/10/17 0957 Oral     SpO2 01/10/17 0957 100 %     Weight 01/10/17 0955 155 lb (70.3 kg)     Height 01/10/17 0955 5\' 6"  (1.676 m)     Head Circumference --      Peak Flow --      Pain Score 01/10/17 0955 7     Pain Loc --      Pain Edu? --      Excl. in GC? --     Constitutional: Alert and oriented. Well appearing and in no acute distress. Eyes: Conjunctivae are normal. Head: Atraumatic. No sinus tenderness to palpation. No swelling.  No erythema.  Ears: Right: Nontender, mild cerumen impaction, otherwise normal canal, no erythema, normal TM.  Left: Nontender to movement, total cerumen impaction, post cerumen removal, mildly irritated canal, no swelling or drainage in canal ,erythema mod TM and dullness.  No mastoid tenderness bilaterally.  Nose:No nasal congestion  Mouth/Throat: Mucous membranes are moist. No pharyngeal erythema. No tonsillar swelling or exudate.  Neck: No stridor.  No cervical spine tenderness to palpation. Hematological/Lymphatic/Immunilogical: No cervical lymphadenopathy. Cardiovascular: Normal rate, regular rhythm. Grossly normal heart sounds.  Good peripheral circulation. Respiratory: Normal respiratory  effort.  No retractions. No wheezes, rales or rhonchi. Good air movement.  Musculoskeletal: Ambulatory with steady gait.  Neurologic:  Normal speech and language. No gait instability. Skin:  Skin appears warm, dry and intact. No rash noted. Psychiatric: Mood and affect are normal. Speech and behavior are normal.   ___________________________________________   LABS (all labs ordered are listed, but only abnormal results are displayed)  Labs Reviewed - No data to display  PROCEDURES Procedures  Cerumen impaction noted.  Wax is removed by syringing and manual debridement. Instructions for home care to prevent wax buildup are given.   INITIAL IMPRESSION / ASSESSMENT AND PLAN / ED COURSE  Pertinent labs & imaging results that were available during my care of the patient were reviewed by me and considered in my medical decision making (see chart for details).  Well-appearing patient.  No acute distress.  Cerumen impaction bilaterally, removed by curette and irrigation by RN.  Patient reports improved post removal.   left otitis media noted.  Will treat with oral amoxicillin.  Encourage rest, fluids, supportive care.Discussed indication, risks and benefits of medications with patient.  Discussed follow up with Primary care physician this week. Discussed follow up and return parameters including no resolution or any worsening concerns. Patient verbalized understanding and agreed to plan.   ____________________________________________   FINAL CLINICAL IMPRESSION(S) / ED DIAGNOSES  Final diagnoses:  Bilateral impacted cerumen  Left otitis media, unspecified otitis media type     ED Discharge Orders        Ordered    amoxicillin (AMOXIL) 875 MG tablet  2 times daily     01/10/17 1046       Note: This dictation was prepared with Dragon dictation along with smaller phrase technology. Any transcriptional errors that result from this process are unintentional.         Renford Dills, NP 01/10/17 1230

## 2017-01-10 NOTE — Discharge Instructions (Signed)
Take medication as prescribed. Rest. Drink plenty of fluids.  ° °Follow up with your primary care physician this week as needed. Return to Urgent care for new or worsening concerns.  ° °

## 2017-01-10 NOTE — ED Triage Notes (Signed)
Patient complains of left ear pain that started 1 week ago. Patient states that she can barely hear out of the ear.

## 2017-01-13 ENCOUNTER — Telehealth: Payer: Self-pay | Admitting: Emergency Medicine

## 2017-01-13 NOTE — Telephone Encounter (Signed)
Left message to follow-up with patient regarding her recent visit to [facility].

## 2017-01-19 ENCOUNTER — Ambulatory Visit (INDEPENDENT_AMBULATORY_CARE_PROVIDER_SITE_OTHER): Payer: BLUE CROSS/BLUE SHIELD

## 2017-01-19 ENCOUNTER — Ambulatory Visit: Payer: BLUE CROSS/BLUE SHIELD | Admitting: Podiatry

## 2017-01-19 ENCOUNTER — Encounter: Payer: Self-pay | Admitting: Podiatry

## 2017-01-19 DIAGNOSIS — M722 Plantar fascial fibromatosis: Secondary | ICD-10-CM

## 2017-01-19 MED ORDER — MELOXICAM 15 MG PO TABS: 15.0000 mg | ORAL_TABLET | Freq: Every day | ORAL | 1 refills | Status: AC

## 2017-01-19 NOTE — Progress Notes (Signed)
   Subjective: Patient presents today for pain and tenderness in the plantar aspect of the left heel that started three months ago. Patient states that it hurts in the mornings with the first steps out of bed. Walking and standing at work also increase the pain. There are no alleviating factors noted. She has not done anything for treatment. Patient presents today for further treatment and evaluation.   Past Medical History:  Diagnosis Date  . Migraine   . Mild depression (HCC)    age 32     Objective: Physical Exam General: The patient is alert and oriented x3 in no acute distress.  Dermatology: Skin is warm, dry and supple bilateral lower extremities. Negative for open lesions or macerations bilateral.   Vascular: Dorsalis Pedis and Posterior Tibial pulses palpable bilateral.  Capillary fill time is immediate to all digits.  Neurological: Epicritic and protective threshold intact bilateral.   Musculoskeletal: Tenderness to palpation at the medial calcaneal tubercale and through the insertion of the plantar fascia of the left foot. All other joints range of motion within normal limits bilateral. Strength 5/5 in all groups bilateral.   Radiographic exam:   Normal osseous mineralization. Joint spaces preserved. No fracture/dislocation/boney destruction. Calcaneal spur present with mild thickening of plantar fascia left. No other soft tissue abnormalities or radiopaque foreign bodies.   Assessment: 1. Plantar fasciitis left foot  Plan of Care:  1. Patient evaluated. Xrays reviewed.   2. Injection of 0.5cc Celestone soluspan injected into the left plantar fascia.  3. Prescription for Meloxicam provided to patient.  4. Plantar fascial band(s) dispensed  5. Instructed patient regarding therapies and modalities at home to alleviate symptoms.  6. Return to clinic in 4 weeks.     Felecia ShellingBrent M. Evans, DPM [facility]  Dr. Felecia ShellingBrent M. Evans, DPM    [address]                                      [place], [place] [address]                Office [phone]  Fax [phone]) [phone]

## 2017-02-16 ENCOUNTER — Ambulatory Visit: Payer: BLUE CROSS/BLUE SHIELD | Admitting: Podiatry

## 2017-02-23 ENCOUNTER — Ambulatory Visit: Payer: BLUE CROSS/BLUE SHIELD | Admitting: Podiatry

## 2017-02-23 ENCOUNTER — Encounter: Payer: Self-pay | Admitting: Podiatry

## 2017-02-23 DIAGNOSIS — M722 Plantar fascial fibromatosis: Secondary | ICD-10-CM

## 2017-02-23 NOTE — Progress Notes (Signed)
   Subjective: Patient presents today for follow up evaluation of left plantar fasciitis. She reports improvement in the pain but states it is still present. She reports some relief after receiving the injection at the previous visit and also with taking Meloxicam. Patient presents today for further treatment and evaluation.   Past Medical History:  Diagnosis Date  . Migraine   . Mild depression (HCC)    age 32     Objective: Physical Exam General: The patient is alert and oriented x3 in no acute distress.  Dermatology: Skin is warm, dry and supple bilateral lower extremities. Negative for open lesions or macerations bilateral.   Vascular: Dorsalis Pedis and Posterior Tibial pulses palpable bilateral.  Capillary fill time is immediate to all digits.  Neurological: Epicritic and protective threshold intact bilateral.   Musculoskeletal: Tenderness to palpation at the medial calcaneal tubercale and through the insertion of the plantar fascia of the left foot. All other joints range of motion within normal limits bilateral. Strength 5/5 in all groups bilateral.   Assessment: 1. Plantar fasciitis left foot 2. H/o plantar fasciitis right 2 years ago  Plan of Care:  1. Patient evaluated.  2. Injection of 0.5cc Celestone soluspan injected into the left plantar fascia.  3. Continue taking Mobic as directed and wearing the fascial brace. 4. Appointment with [facility] for custom molded orthotics.  5. Return to clinic in 4 weeks.   Works nights standing for the entire shift.      Felecia ShellingBrent M. Evans, DPM [facility]  Dr. Felecia ShellingBrent M. Evans, DPM    [address]                                     [place], [place] [address]                Office [phone]  Fax [phone]

## 2017-03-14 ENCOUNTER — Other Ambulatory Visit: Payer: BLUE CROSS/BLUE SHIELD | Admitting: Orthotics

## 2017-03-27 ENCOUNTER — Ambulatory Visit: Payer: BLUE CROSS/BLUE SHIELD | Admitting: Podiatry

## 2017-04-04 ENCOUNTER — Ambulatory Visit (INDEPENDENT_AMBULATORY_CARE_PROVIDER_SITE_OTHER): Payer: BLUE CROSS/BLUE SHIELD | Admitting: Orthotics

## 2017-04-04 DIAGNOSIS — M722 Plantar fascial fibromatosis: Secondary | ICD-10-CM

## 2017-04-04 NOTE — Progress Notes (Signed)

## 2017-04-24 ENCOUNTER — Encounter: Payer: Self-pay | Admitting: Podiatry

## 2017-04-24 ENCOUNTER — Ambulatory Visit: Payer: BLUE CROSS/BLUE SHIELD | Admitting: Podiatry

## 2017-04-24 DIAGNOSIS — M722 Plantar fascial fibromatosis: Secondary | ICD-10-CM

## 2017-04-24 MED ORDER — METHYLPREDNISOLONE 4 MG PO TABS: ORAL_TABLET | ORAL | 0 refills | Status: AC

## 2017-04-25 ENCOUNTER — Ambulatory Visit: Payer: BLUE CROSS/BLUE SHIELD | Admitting: Orthotics

## 2017-04-25 DIAGNOSIS — M722 Plantar fascial fibromatosis: Secondary | ICD-10-CM

## 2017-04-25 NOTE — Progress Notes (Signed)
Patient came in today to pick up custom made foot orthotics.  The goals were accomplished and the patient reported no dissatisfaction with said orthotics.  Patient was advised of breakin period and how to report any issues. 

## 2017-04-26 NOTE — Progress Notes (Signed)
   Subjective: Patient presents today for follow up evaluation of left plantar fasciitis. She states the pain has not changed since her previous visit. She reports working 10 hour shifts last week which did not help the pain at all. She has been taking Meloxicam with some relief. Patient presents today for further treatment and evaluation.   Past Medical History:  Diagnosis Date  . Migraine   . Mild depression (HCC)    age 32     Objective: Physical Exam General: The patient is alert and oriented x3 in no acute distress.  Dermatology: Skin is warm, dry and supple bilateral lower extremities. Negative for open lesions or macerations bilateral.   Vascular: Dorsalis Pedis and Posterior Tibial pulses palpable bilateral.  Capillary fill time is immediate to all digits.  Neurological: Epicritic and protective threshold intact bilateral.   Musculoskeletal: Tenderness to palpation at the medial calcaneal tubercale and through the insertion of the plantar fascia of the left foot. All other joints range of motion within normal limits bilateral. Strength 5/5 in all groups bilateral.   Assessment: 1. Plantar fasciitis left foot 2. H/o plantar fasciitis right 2 years ago  Plan of Care:  1. Patient evaluated.  2. Patient declined injections today since they have not worked previously.  3. Prescription for Medrol Dose Pak provided to patient.  4. Continue taking Meloxicam as directed.  5. Picks up custom molded orthotics from [facility] tomorrow.  6. Return to clinic in 4 weeks.    Works nights standing for the entire shift.      Felecia ShellingBrent M. Robinson Brinkley, DPM [facility]  Dr. Felecia ShellingBrent M. Anwitha Mapes, DPM    [address]                                     [place], [place] [address]                Office [phone]  Fax [phone]

## 2017-05-25 ENCOUNTER — Ambulatory Visit: Payer: BLUE CROSS/BLUE SHIELD | Admitting: Podiatry

## 2017-06-05 ENCOUNTER — Ambulatory Visit: Payer: BLUE CROSS/BLUE SHIELD | Admitting: Podiatry

## 2017-06-29 ENCOUNTER — Ambulatory Visit: Payer: BLUE CROSS/BLUE SHIELD | Admitting: Podiatry

## 2018-03-17 IMAGING — CT CT RENAL STONE PROTOCOL
3 of 4 series · 6 of 46 positions shown, 11 images · non-contrast
Comparison: None.

CLINICAL DATA: Right flank pain

EXAM:
CT ABDOMEN AND PELVIS WITHOUT CONTRAST
TECHNIQUE: Multidetector CT imaging of the abdomen and pelvis was performed
following the standard protocol without IV contrast.

[Series 4: lung bases · axial · 0.83mm/px · z∈[-483,-463]mm · 2 of 13 slices shown, 5 images]
[im 5/13  soft-tissue]
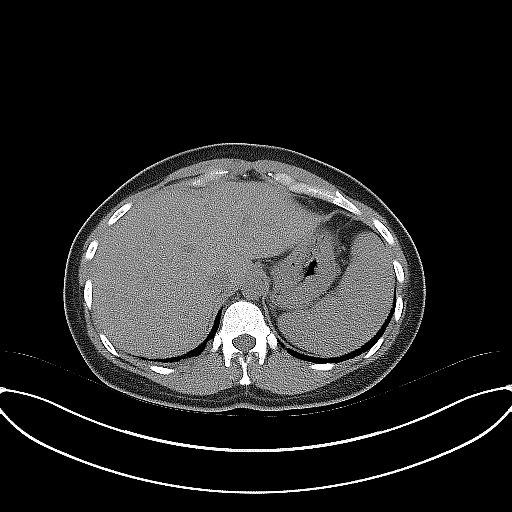
[im 5/13  lung]
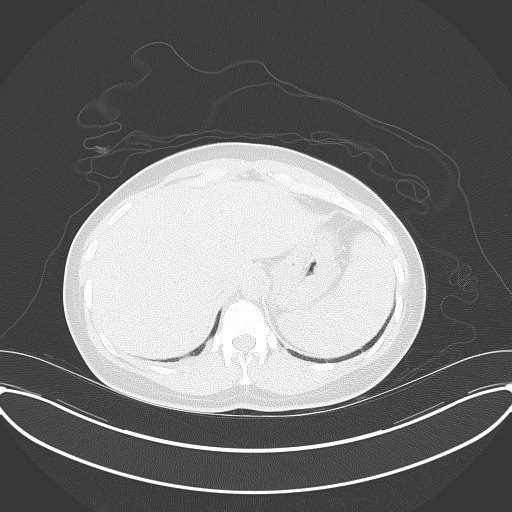
[im 5/13  bone]
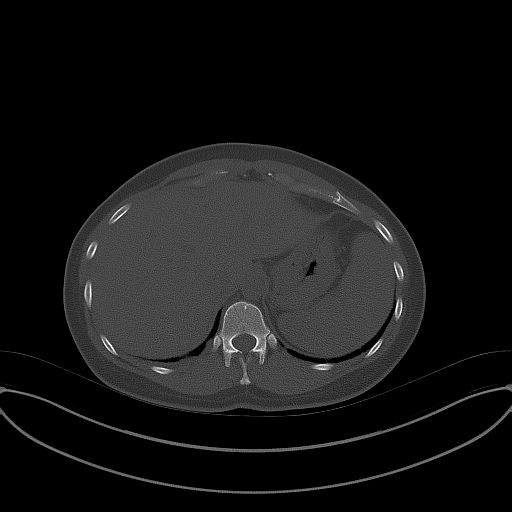
[im 9/13  soft-tissue]
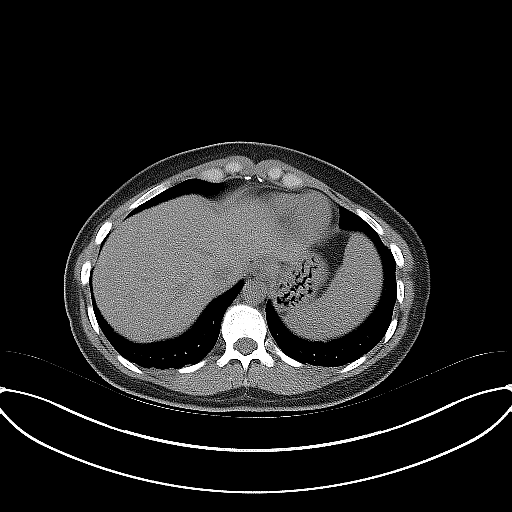
[im 9/13  lung]
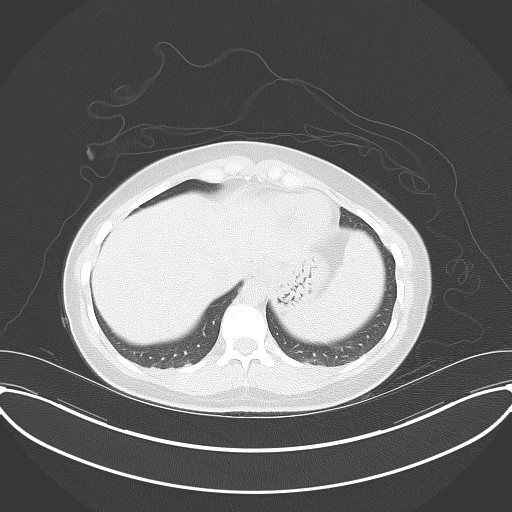

[Series 5: coronal · coronal · 0.80mm/px · 3 of 151 slices shown, 4 images]
[im 51/151  soft-tissue]
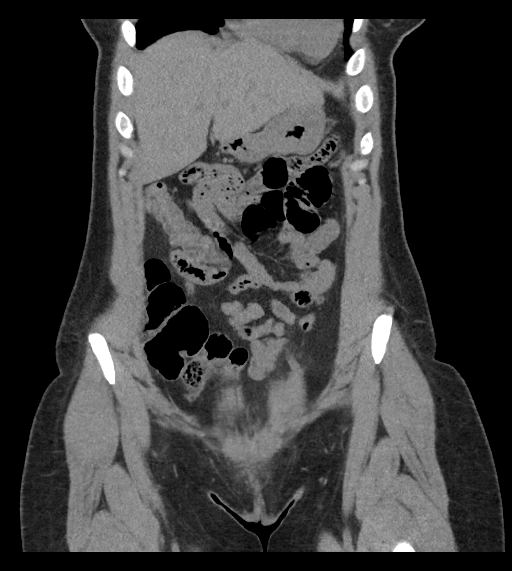
[im 67/151  soft-tissue]
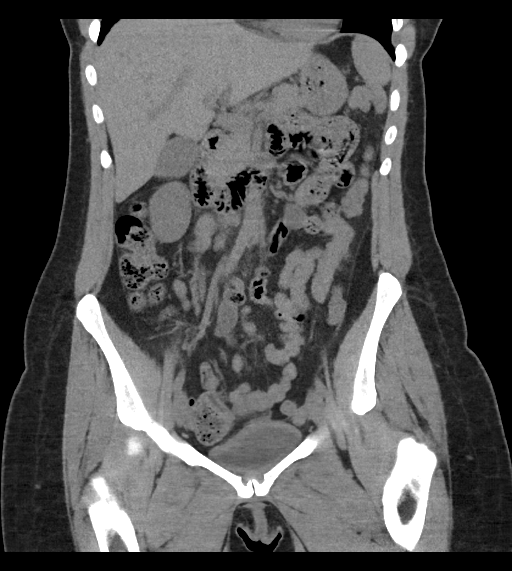
[im 67/151  bone]
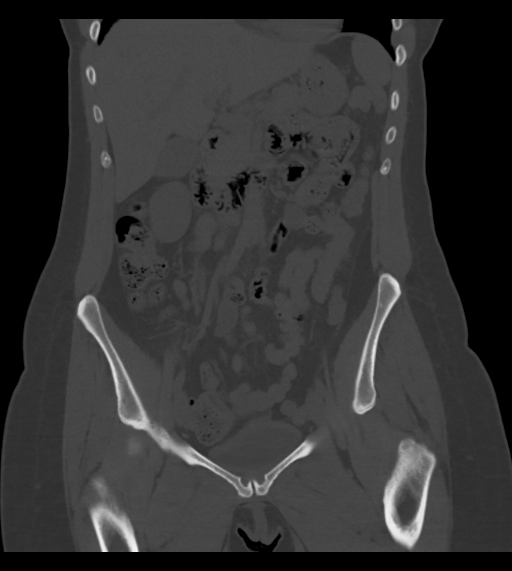
[im 84/151  soft-tissue]
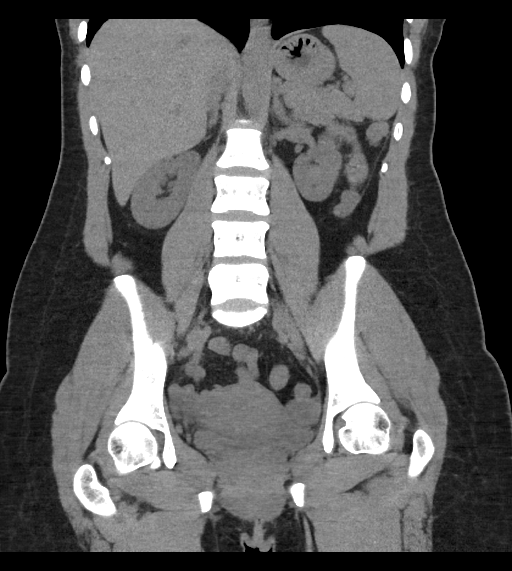

[Series 6: sagittal · sagittal · 0.60mm/px · 1 of 151 slices shown, 2 images]
[im 51/151  soft-tissue]
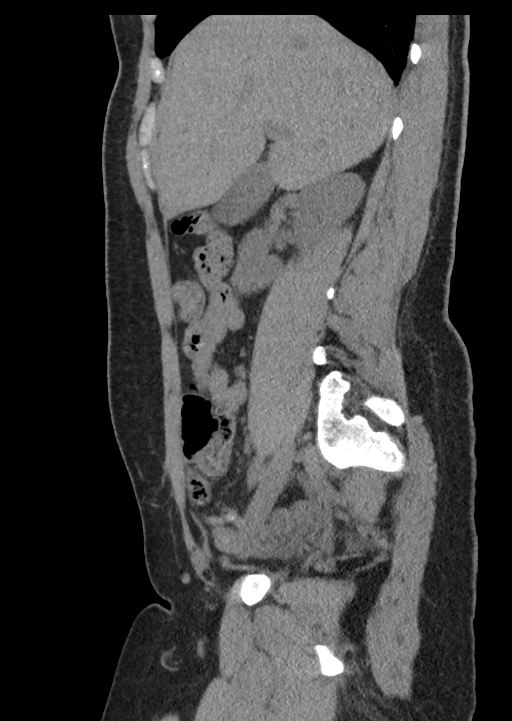
[im 51/151  bone]
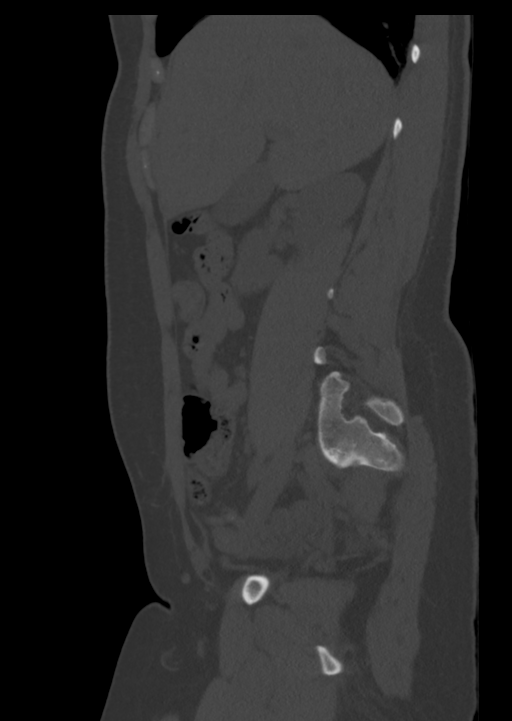

[6 of 46 positions shown; findings below may reference images not displayed]

FINDINGS: Lower chest: The lung bases are clear.

Hepatobiliary: No focal hepatic lesions or intrahepatic biliary
dilatation. The gallbladder is normal. No common bile duct
dilatation.

Pancreas: No mass, inflammation or ductal dilatation.

Spleen: Normal size.  No focal lesions.

Adrenals/Urinary Tract: The adrenal glands are normal.

Mild right-sided hydronephrosis and right hydroureter but no
obstructing ureteral calculi or bladder calculi. Possible recently
passed calculus. Tiny midpole lower pole junction region calculus is
noted on the right. No left-sided renal or ureteral calculi. No
worrisome renal or bladder lesions.

Stomach/Bowel: The stomach, duodenum, small bowel and colon are
grossly normal without oral contrast. No inflammatory changes, mass
lesions or obstructive findings. The terminal ileum and appendix are
normal.

Vascular/Lymphatic: The aorta is normal in caliber. No
atherosclerotic calcifications. No mesenteric or retroperitoneal
mass or adenopathy. Small scattered lymph nodes are noted.

Reproductive: The uterus and ovaries are grossly normal. Slightly
prominent endometrium likely due to secretory phase of ovulation.

Other: Small amount of free pelvic fluid, likely physiologic. No
pelvic mass or adenopathy. No inguinal mass. No abdominal wall
hernia or subcutaneous lesions.

Musculoskeletal: No significant bony findings.
IMPRESSION: 1. Mild right-sided hydronephrosis without obstructing ureteral
calculus or bladder calculus. Possible recently passed calculus.
2. Tiny midpole lower pole junction region right renal calculus.
3. No other significant abdominal/pelvic findings without IV or oral
contrast.

## 2020-10-09 ENCOUNTER — Ambulatory Visit
Admission: EM | Admit: 2020-10-09 | Discharge: 2020-10-09 | Disposition: A | Discharge status: Home / Self Care | Payer: Commercial Managed Care - PPO | Attending: Emergency Medicine | Admitting: Emergency Medicine

## 2020-10-09 ENCOUNTER — Other Ambulatory Visit: Payer: Self-pay

## 2020-10-09 DIAGNOSIS — H109 Unspecified conjunctivitis: Secondary | ICD-10-CM | POA: Diagnosis not present

## 2020-10-09 DIAGNOSIS — H66003 Acute suppurative otitis media without spontaneous rupture of ear drum, bilateral: Secondary | ICD-10-CM | POA: Diagnosis not present

## 2020-10-09 DIAGNOSIS — J069 Acute upper respiratory infection, unspecified: Secondary | ICD-10-CM | POA: Diagnosis not present

## 2020-10-09 MED ORDER — PROMETHAZINE-DM 6.25-15 MG/5ML PO SYRP: 5.0000 mL | ORAL_SOLUTION | Freq: Four times a day (QID) | ORAL | 0 refills | Status: AC | PRN

## 2020-10-09 MED ORDER — IPRATROPIUM BROMIDE 0.06 % NA SOLN: 2.0000 | Freq: Four times a day (QID) | NASAL | 12 refills | Status: AC

## 2020-10-09 MED ORDER — BENZONATATE 100 MG PO CAPS: 200.0000 mg | ORAL_CAPSULE | Freq: Three times a day (TID) | ORAL | 0 refills | Status: AC

## 2020-10-09 MED ORDER — AMOXICILLIN-POT CLAVULANATE 875-125 MG PO TABS: 1.0000 | ORAL_TABLET | Freq: Two times a day (BID) | ORAL | 0 refills | Status: AC

## 2020-10-09 NOTE — Discharge Instructions (Addendum)
Take the Augmentin twice daily for 10 days with food for treatment of your ear infection.  Take an over-the-counter probiotic 1 hour after each dose of antibiotic to prevent diarrhea.  Use over-the-counter Tylenol and ibuprofen as needed for pain or fever.  Place a hot water bottle, or heating pad, underneath your pillowcase at night to help dilate up your ear and aid in pain relief as well as resolution of the infection.  Use the Atrovent nasal spray, 2 squirts in each nostril every 6 hours, as needed for runny nose and postnasal drip.  Use the Tessalon Perles every 8 hours during the day.  Take them with a small sip of water.  They may give you some numbness to the base of your tongue or a metallic taste in your mouth, this is normal.  Use the Promethazine DM cough syrup at bedtime for cough and congestion.  It will make you drowsy so do not take it during the day.  Avoid touching your eyes as much as possible.  Wipe down all surfaces, countertops, and doorknobs after the first and second 24 hours on eyedrops.  Wash her face with a clean wash rag to remove any drainage and use a different portion of the wash rag to clean each eye so as to not reinfect yourself.  Return for reevaluation or see your primary care provider for any new or worsening symptoms.

## 2020-10-09 NOTE — ED Triage Notes (Signed)
Patient presents to Urgent Care with complaints of bilateral eye drainage today, cough and sore throat x 3 days, and bilateral ear pain x 1 week ago. Treating symptoms with Advil and Nyquil.   Denies fever.

## 2020-10-09 NOTE — ED Provider Notes (Signed)
[facility]    CSN: [id number] Arrival date & time: 10/09/20  0801      History   Chief Complaint Chief Complaint  Patient presents with   Conjunctivitis   Sore Throat   Otalgia    HPI Lauren [name] is a 35 y.o. female.   HPI  58 old female here for evaluation of upper respiratory symptoms.  Patient reports that she is been having pain in both of her ears for the last week and this is in conjunction with the ringing and some intermittent dizziness.  3 days ago she developed a cough and a sore throat that is sharp in nature.  She reports her cough is productive for yellow sputum.  Today she is complaining of bilateral eye irritation and watery discharge.  She is also had a runny nose, nasal congestion, and clear nasal discharge.  She denies any itching of her eyes, shortness breath or wheezing, or GI complaints.  Her husband and children have similar symptoms but she reports that hers have lasted longer than theirs.  Past Medical History:  Diagnosis Date   Migraine    Mild depression    age 58    There are no problems to display for this patient.   Past Surgical History:  Procedure Laterality Date   CESAREAN SECTION N/A 07/05/2015   Procedure: CESAREAN SECTION,  Bilateral salpingectomy;  Surgeon: Elenora Fender Ward, MD;  Location: [facility] ORS;  Service: Obstetrics;  Laterality: N/A;   low transverse cesarean  09/22/2012   [place]-Fetal indications at 36 wk   TUBAL LIGATION      OB History     Gravida  2   Para  2   Term  1   Preterm  1   AB  0   Living  2      SAB  0   IAB  0   Ectopic  0   Multiple  0   Live Births  2            Home Medications    Prior to Admission medications   Medication Sig Start Date End Date Taking? Authorizing Provider  amoxicillin-clavulanate (AUGMENTIN) 875-125 MG tablet Take 1 tablet by mouth every 12 (twelve) hours for 10 days. 10/09/20 10/19/20 Yes Becky Augusta, NP  benzonatate (TESSALON) 100 MG  capsule Take 2 capsules (200 mg total) by mouth every 8 (eight) hours. 10/09/20  Yes Becky Augusta, NP  ipratropium (ATROVENT) 0.06 % nasal spray Place 2 sprays into both nostrils 4 (four) times daily. 10/09/20  Yes Becky Augusta, NP  promethazine-dextromethorphan (PROMETHAZINE-DM) 6.25-15 MG/5ML syrup Take 5 mLs by mouth 4 (four) times daily as needed. 10/09/20  Yes Becky Augusta, NP  methylPREDNISolone (MEDROL) 4 MG tablet See pharmacy note 04/24/17   Felecia Shelling, DPM  Multiple Vitamins-Minerals (MULTIVITAMIN ADULT PO) Take by mouth.    [provider]    Family History Family History  Problem Relation Age of Onset   Heart disease Mother    Hypercholesterolemia Mother    Diabetes Maternal Grandmother        [place]   Cancer Maternal Grandmother    Diabetes Maternal Aunt    Cancer Maternal Aunt     Social History Social History   Tobacco Use   Smoking status: Never   Smokeless tobacco: Never  Vaping Use   Vaping Use: Never used  Substance Use Topics   Alcohol use: No    Alcohol/week: 0.0 standard drinks  Drug use: No     Allergies   Peanut-containing drug products   Review of Systems Review of Systems  Constitutional:  Positive for fever. Negative for activity change and appetite change.  HENT:  Positive for congestion, ear pain, postnasal drip, rhinorrhea and sore throat. Negative for ear discharge.   Eyes:  Positive for discharge and redness. Negative for photophobia, pain, itching and visual disturbance.  Respiratory:  Positive for cough. Negative for shortness of breath and wheezing.   Gastrointestinal:  Negative for diarrhea, nausea and vomiting.    Physical Exam Triage Vital Signs ED Triage Vitals  Enc Vitals Group     BP 10/09/20 0814 111/82     Pulse Rate 10/09/20 0814 83     Resp 10/09/20 0814 16     Temp 10/09/20 0814 98.4 F (36.9 C)     Temp Source 10/09/20 0814 Oral     SpO2 10/09/20 0814 100 %     Weight --      Height --       Head Circumference --      Peak Flow --      Pain Score 10/09/20 0819 8     Pain Loc --      Pain Edu? --      Excl. in GC? --    No data found.  Updated Vital Signs BP 111/82 (BP Location: Left Arm)   Pulse 83   Temp 98.4 F (36.9 C) (Oral)   Resp 16   LMP 10/04/2020   SpO2 100%   Visual Acuity Right Eye Distance:   Left Eye Distance:   Bilateral Distance:    Right Eye Near:   Left Eye Near:    Bilateral Near:     Physical Exam Vitals and nursing note reviewed.  Constitutional:      General: She is not in acute distress.    Appearance: Normal appearance. She is not ill-appearing.  HENT:     Head: Normocephalic and atraumatic.     Right Ear: Ear canal and external ear normal. There is no impacted cerumen.     Left Ear: Ear canal and external ear normal. There is no impacted cerumen.     Ears:     Comments: Bilateral tympanic membranes are erythematous with loss of landmarks.    Nose: Congestion and rhinorrhea present.     Comments: Mucosa is erythematous and edematous with copious clear nasal discharge.    Mouth/Throat:     Mouth: Mucous membranes are moist.     Pharynx: Oropharynx is clear. Posterior oropharyngeal erythema present.     Comments: Posterior oropharyngeal erythema with clear postnasal drip. Eyes:     General: No scleral icterus.       Right eye: Discharge present.        Left eye: Discharge present.    Extraocular Movements: Extraocular movements intact.     Pupils: Pupils are equal, round, and reactive to light.  Cardiovascular:     Rate and Rhythm: Normal rate and regular rhythm.     Pulses: Normal pulses.     Heart sounds: Normal heart sounds. No murmur heard.   No gallop.  Pulmonary:     Effort: Pulmonary effort is normal.     Breath sounds: Normal breath sounds. No wheezing, rhonchi or rales.  Musculoskeletal:     Cervical back: Normal range of motion and neck supple.  Lymphadenopathy:     Cervical: No cervical adenopathy.  Skin:     General:  Skin is warm and dry.     Capillary Refill: Capillary refill takes less than 2 seconds.     Findings: No erythema or rash.  Neurological:     General: No focal deficit present.     Mental Status: She is alert and oriented to person, place, and time.  Psychiatric:        Mood and Affect: Mood normal.        Behavior: Behavior normal.        Thought Content: Thought content normal.        Judgment: Judgment normal.     UC Treatments / Results  Labs (all labs ordered are listed, but only abnormal results are displayed) Labs Reviewed - No data to display  EKG   Radiology No results found.  Procedures Procedures (including critical care time)  Medications Ordered in UC Medications - No data to display  Initial Impression / Assessment and Plan / UC Course  I have reviewed the triage vital signs and the nursing notes.  Pertinent labs & imaging results that were available during my care of the patient were reviewed by me and considered in my medical decision making (see chart for details).  Pleasant, nontoxic-appearing 16 old female here for evaluation of upper respiratory complaints as outlined in the HPI above.  Patient's physical exam reveals erythematous tympanic membranes bilaterally with a loss of landmarks.  No injection or effusion noted.  Bilateral external auditory canals are clear.  Patient's bulbar employable conjunctiva in the right eye are erythematous and injected.  The left eye is unremarkable.  There is watery discharge from both eyes.  EOMs intact and pupils equal round reactive.  Nasal mucosa is erythematous edematous and copious clear nasal discharge.  Oropharyngeal exam reveals posterior oropharyngeal erythema with clear postnasal drip.  No cervical lymphadenopathy patient exam.  Cardiopulmonary exam reveals clear lung sounds in all fields.  Patient exam is consistent with an upper respiratory faction and otitis media.  I believe the conjunctival injection is  secondary to the URI and not bacterial in nature but rather reactive.  We will treat with Augmentin twice daily for 10 days for the otitis media and will give Atrovent nasal spray to help with nasal congestion and runny nose.  Tessalon Perles and Promethazine DM prescribed for cough and congestion.  Work note provided.   Final Clinical Impressions(s) / UC Diagnoses   Final diagnoses:  Upper respiratory tract infection, unspecified type  Non-recurrent acute suppurative otitis media of both ears without spontaneous rupture of tympanic membranes  Conjunctivitis of right eye, unspecified conjunctivitis type     Discharge Instructions      Take the Augmentin twice daily for 10 days with food for treatment of your ear infection.  Take an over-the-counter probiotic 1 hour after each dose of antibiotic to prevent diarrhea.  Use over-the-counter Tylenol and ibuprofen as needed for pain or fever.  Place a hot water bottle, or heating pad, underneath your pillowcase at night to help dilate up your ear and aid in pain relief as well as resolution of the infection.  Use the Atrovent nasal spray, 2 squirts in each nostril every 6 hours, as needed for runny nose and postnasal drip.  Use the Tessalon Perles every 8 hours during the day.  Take them with a small sip of water.  They may give you some numbness to the base of your tongue or a metallic taste in your mouth, this is normal.  Use the Promethazine DM  cough syrup at bedtime for cough and congestion.  It will make you drowsy so do not take it during the day.  Avoid touching your eyes as much as possible.  Wipe down all surfaces, countertops, and doorknobs after the first and second 24 hours on eyedrops.  Wash her face with a clean wash rag to remove any drainage and use a different portion of the wash rag to clean each eye so as to not reinfect yourself.  Return for reevaluation or see your primary care provider for any new or worsening  symptoms.        ED Prescriptions     Medication Sig Dispense Auth. Provider   amoxicillin-clavulanate (AUGMENTIN) 875-125 MG tablet Take 1 tablet by mouth every 12 (twelve) hours for 10 days. 20 tablet Becky Augusta, NP   benzonatate (TESSALON) 100 MG capsule Take 2 capsules (200 mg total) by mouth every 8 (eight) hours. 21 capsule Becky Augusta, NP   ipratropium (ATROVENT) 0.06 % nasal spray Place 2 sprays into both nostrils 4 (four) times daily. 15 mL Becky Augusta, NP   promethazine-dextromethorphan (PROMETHAZINE-DM) 6.25-15 MG/5ML syrup Take 5 mLs by mouth 4 (four) times daily as needed. 118 mL Becky Augusta, NP      PDMP not reviewed this encounter.   Becky Augusta, NP 10/09/20 [phone]
